# Patient Record
Sex: Male | Born: 1960 | Race: Black or African American | Hispanic: No | Smoking: Former smoker
Health system: Southern US, Community
[De-identification: ages and names within clinical notes are randomized; demographics above are authoritative.]

## PROBLEM LIST (undated history)

## (undated) DIAGNOSIS — M5430 Sciatica, unspecified side: Secondary | ICD-10-CM

## (undated) DIAGNOSIS — R319 Hematuria, unspecified: Secondary | ICD-10-CM

## (undated) DIAGNOSIS — G8929 Other chronic pain: Secondary | ICD-10-CM

## (undated) DIAGNOSIS — M549 Dorsalgia, unspecified: Secondary | ICD-10-CM

---

## 2004-02-20 ENCOUNTER — Emergency Department (HOSPITAL_COMMUNITY): Admission: EM | Admit: 2004-02-20 | Discharge: 2004-02-20 | Payer: Self-pay | Admitting: Emergency Medicine

## 2004-02-25 ENCOUNTER — Ambulatory Visit (HOSPITAL_COMMUNITY): Admission: RE | Admit: 2004-02-25 | Discharge: 2004-02-25 | Payer: Self-pay | Admitting: Emergency Medicine

## 2004-07-17 ENCOUNTER — Ambulatory Visit: Payer: Self-pay | Admitting: Sports Medicine

## 2004-08-15 ENCOUNTER — Ambulatory Visit: Payer: Self-pay | Admitting: Family Medicine

## 2004-09-10 ENCOUNTER — Ambulatory Visit: Payer: Self-pay | Admitting: Family Medicine

## 2004-10-15 ENCOUNTER — Ambulatory Visit: Payer: Self-pay | Admitting: Family Medicine

## 2004-11-18 ENCOUNTER — Ambulatory Visit: Payer: Self-pay | Admitting: Sports Medicine

## 2004-12-18 ENCOUNTER — Ambulatory Visit: Payer: Self-pay | Admitting: Family Medicine

## 2005-01-17 ENCOUNTER — Ambulatory Visit: Payer: Self-pay | Admitting: Family Medicine

## 2006-01-10 ENCOUNTER — Emergency Department (HOSPITAL_COMMUNITY): Admission: EM | Admit: 2006-01-10 | Discharge: 2006-01-10 | Payer: Self-pay | Admitting: Emergency Medicine

## 2006-06-27 ENCOUNTER — Emergency Department (HOSPITAL_COMMUNITY): Admission: EM | Admit: 2006-06-27 | Discharge: 2006-06-27 | Payer: Self-pay | Admitting: Emergency Medicine

## 2006-06-29 ENCOUNTER — Ambulatory Visit: Payer: Self-pay | Admitting: Sports Medicine

## 2006-06-30 ENCOUNTER — Ambulatory Visit (HOSPITAL_COMMUNITY): Admission: RE | Admit: 2006-06-30 | Discharge: 2006-06-30 | Payer: Self-pay | Admitting: Sports Medicine

## 2006-07-13 ENCOUNTER — Ambulatory Visit: Payer: Self-pay | Admitting: Family Medicine

## 2006-07-20 ENCOUNTER — Ambulatory Visit (HOSPITAL_COMMUNITY): Admission: RE | Admit: 2006-07-20 | Discharge: 2006-07-20 | Payer: Self-pay | Admitting: Neurosurgery

## 2006-07-23 ENCOUNTER — Encounter: Payer: Self-pay | Admitting: Family Medicine

## 2006-07-23 ENCOUNTER — Ambulatory Visit: Payer: Self-pay | Admitting: Family Medicine

## 2009-10-22 ENCOUNTER — Emergency Department (HOSPITAL_COMMUNITY): Admission: EM | Admit: 2009-10-22 | Discharge: 2009-10-22 | Payer: Self-pay | Admitting: Emergency Medicine

## 2009-11-07 ENCOUNTER — Ambulatory Visit: Payer: Self-pay | Admitting: Family Medicine

## 2009-11-07 DIAGNOSIS — R300 Dysuria: Secondary | ICD-10-CM

## 2009-11-07 DIAGNOSIS — F17200 Nicotine dependence, unspecified, uncomplicated: Secondary | ICD-10-CM | POA: Insufficient documentation

## 2009-11-07 DIAGNOSIS — Q762 Congenital spondylolisthesis: Secondary | ICD-10-CM

## 2009-11-07 DIAGNOSIS — M25559 Pain in unspecified hip: Secondary | ICD-10-CM

## 2009-11-08 ENCOUNTER — Ambulatory Visit: Payer: Self-pay | Admitting: Family Medicine

## 2009-11-08 ENCOUNTER — Encounter: Payer: Self-pay | Admitting: Family Medicine

## 2009-11-09 LAB — CONVERTED CEMR LAB
CO2: 21 meq/L (ref 19–32)
Calcium: 9 mg/dL (ref 8.4–10.5)
Chloride: 104 meq/L (ref 96–112)
Cholesterol: 214 mg/dL — ABNORMAL HIGH (ref 0–200)
Creatinine, Ser: 0.74 mg/dL (ref 0.40–1.50)
Glucose, Bld: 81 mg/dL (ref 70–99)
Sodium: 139 meq/L (ref 135–145)
Total CHOL/HDL Ratio: 3.1
Triglycerides: 71 mg/dL (ref <150)

## 2009-11-13 LAB — CONVERTED CEMR LAB
Bilirubin Urine: NEGATIVE
Glucose, Urine, Semiquant: NEGATIVE
Protein, U semiquant: 30
Specific Gravity, Urine: >=1.03
Urobilinogen, UA: 0.2
WBC Urine, dipstick: NEGATIVE

## 2009-11-14 ENCOUNTER — Ambulatory Visit: Payer: Self-pay | Admitting: Family Medicine

## 2009-11-15 ENCOUNTER — Encounter: Payer: Self-pay | Admitting: Family Medicine

## 2009-11-15 LAB — CONVERTED CEMR LAB
Ketones, urine, test strip: NEGATIVE
Nitrite: NEGATIVE
Protein, U semiquant: NEGATIVE
Specific Gravity, Urine: >=1.03
WBC Urine, dipstick: NEGATIVE

## 2009-11-20 ENCOUNTER — Ambulatory Visit: Payer: Self-pay | Admitting: Family Medicine

## 2009-11-20 DIAGNOSIS — R319 Hematuria, unspecified: Secondary | ICD-10-CM

## 2009-12-07 ENCOUNTER — Encounter: Payer: Self-pay | Admitting: Family Medicine

## 2010-01-16 ENCOUNTER — Encounter: Payer: Self-pay | Admitting: Family Medicine

## 2010-07-03 NOTE — Consult Note (Signed)
[facility]   Imported By: Clydell Hakim 01/22/2010 10:32:00  _____________________________________________________________________  External Attachment:    Type:   Image     Comment:   External Document

## 2010-07-03 NOTE — Assessment & Plan Note (Signed)
Summary: 50yo M new pt   Vital Signs:  Patient profile:   50 year old male Height:      65.5 inches Weight:      168.2 pounds BMI:     27.66 Temp:     98.6 degrees F oral Pulse rate:   85 / minute BP sitting:   136 / 84  (left arm) Cuff size:   regular  Vitals Entered By: Gladstone Pih (November 07, 2009 4:38 PM) CC: L hip pain Pain Assessment Patient in pain? yes     Location: back Intensity: 7 Type: aching Onset of pain  Chronic Comments last seen approx 2008   Primary Care Provider:  Marisue Ivan, MD  CC:  L hip pain.  History of Present Illness: 50yo M new patient to me  Health Problems:   1. L hip pain: Chronic issue.  He was told that it was bursitis and was given meloxicam which has mildly improve his symptoms.  Denies any trauma or fall.  No pain with movements.  No radiating pain into the groin area.  Pain with palpation.  No erythema, edema, or ecchymosis.  2. Tobacco user: 1/4 ppd.    3.  Acute dysuria and hematuria: x several days.  No penile discharge.  No scrotal pain.  No fevers, abd/pelvic pain.  Unable to provide urine today.  4. Hx of spondylolisthesis of L4/L5    Habits & Providers  Alcohol-Tobacco-Diet     Tobacco Status: current     Tobacco Counseling: to quit use of tobacco products     Cigarette Packs/Day: <0.25  Current Medications (verified): 1)  Meloxicam 7.5 Mg Tabs (Meloxicam) .Marland Kitchen.. 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 Well appearing, NAD.  Neck:  supple, full ROM, no goiter or mass  Lungs:  Normal respiratory effort, chest expands symmetrically. Lungs are clear to auscultation, no crackles or wheezes. Heart:  Normal rate and regular rhythm. S1 and S2 normal without gallop, murmur, click, rub or other extra sounds. Abdomen:  Soft, NT, ND, no HSM, active BS  Prostate:  declined Msk:  no joint effusion or erythema FROM all joints L hip- nl internal/external rotation; nl flexion; neg log roll; ttp of greater trochanter Extremities:  no peripheral edema Neurologic:  no focal deficits 5/5 strength throughout Skin:  no lesions   Impression & Recommendations:  Problem # 1:  DYSURIA (ICD-788.1) Assessment New Concern for UTI vs. Prostatitis vs. other infection. Pt unable to provide urine.  Afebrile and no scrotal pain or discharge.  Pt declined prostate exam. Plan to have him come in tomorrow to provide UA sample.    Future Orders: Urinalysis-[facility] (00000) ... 11/08/2009  Problem # 2:  HIP PAIN, LEFT (ICD-719.45) Assessment: Unchanged Likely L greater trochanteric bursitis. Plan to complete the course of Meloxicam and if he is still  having discomfort, will discuss possible steroid injection.  His updated medication list for this problem includes:    Meloxicam 7.5 Mg Tabs (Meloxicam) .Marland Kitchen... 1 tab by mouth two times a day  Problem # 3:  SPONDYLOLISTHESIS (WGN-562.13) Assessment: Comment Only No neurological deficits. Will f/u accordingly.  Problem # 4:  TOBACCO USER (ICD-305.1) Assessment: Unchanged 1/4 ppd.  Smoking cessation counseling provided. Contemplative stage.  Problem # 5:  HEALTH MAINTENANCE EXAM (ICD-V70.0) Assessment: Comment Only Will check baseline BMET and screen for HLD. Will discuss further early PSA  screening b/c of ethnicity and higher risks.  Orders: [facility] - Est  40-64 yrs (08657)  Complete Medication List: 1)  Meloxicam 7.5 Mg Tabs (Meloxicam) .Marland Kitchen.. 1 tab by mouth two times a day  Other Orders: Future Orders: Basic Met-[facility] ([id number]) ... 11/08/2009 Lipid-[facility] ([id number]) ... 11/08/2009  Patient Instructions: 1)  Set up an appt after you finish the meloxicam if your pain is not improved.   I agree with you that it is a bursitis and likely needs an injection. 2)  Come back to the lab in the morning without breakfast for bloodwork.  Call the clinic 24 hours ahead of time to let them know.

## 2010-07-03 NOTE — Assessment & Plan Note (Signed)
Summary: hematuria    Vital Signs:  Patient profile:   50 year old male Height:      65.5 inches Weight:      171.9 pounds BMI:     28.27 Temp:     98.3 degrees F oral Pulse rate:   76 / minute BP sitting:   124 / 75  (left arm) Cuff size:   regular  Vitals Entered By: Jone Baseman CMA (November 20, 2009 1:31 PM) CC: F/U labs Is Patient Diabetic? No Pain Assessment Patient in pain? no        Primary Care Provider:  Marisue Ivan, MD  CC:  F/U labs.  History of Present Illness: 50yo M here to f/u on hematuria  Hematuria: >2 weeks.  UA positive for blood x 2.  Ur cx neg.  Pt reports intermittent hematuria.  No clots.  Occasional dysuria.  No fevers, chills, flank pain, penile discharge, abd/pelvic pain, or family hx of kidney disease.  No trouble starting/stopping or dribbling.  Habits & Providers  Alcohol-Tobacco-Diet     Tobacco Status: current     Tobacco Counseling: to quit use of tobacco products     Cigarette Packs/Day: <0.25  Current Medications (verified): 1)  None  Allergies (verified): No Known Drug Allergies  Review of Systems      See HPI  Physical Exam  General:  VS Reviewed. Well appearing, NAD.  Abdomen:  Soft, NT, ND, no HSM, active BS  Genitalia:  No penile discharge; no lesions or abrasions of the meatus; no scrotal tenderness Prostate:  Prostate gland firm and smooth, no enlargement, nodularity, tenderness, mass, asymmetry or induration.   Impression & Recommendations:  Problem # 1:  HEMATURIA UNSPECIFIED (ICD-599.70) Assessment Unchanged Persistent hematuria >2 weeks without clear etiology. UA positive for blood x 2.  Ur cx neg.   Prostate exam nl.  Did not check PSA b/c he going to be referred to urology and will defer further labs. He will likely need further imaging to r/o malignancy.  Orders: Urology Referral (Urology) [facility]- Est Level  3 (16109)  Patient Instructions: 1)  Please follow up with urology for further  evaluation.

## 2010-07-03 NOTE — Consult Note (Signed)
[facility]: [facility]   Imported By: Knox Royalty 12/22/2009 13:24:18  _____________________________________________________________________  External Attachment:    Type:   Image     Comment:   External Document

## 2011-09-05 ENCOUNTER — Ambulatory Visit: Payer: Self-pay

## 2011-09-08 ENCOUNTER — Encounter: Payer: Self-pay | Admitting: Family Medicine

## 2011-09-08 ENCOUNTER — Ambulatory Visit (INDEPENDENT_AMBULATORY_CARE_PROVIDER_SITE_OTHER): Payer: PRIVATE HEALTH INSURANCE | Admitting: Family Medicine

## 2011-09-08 VITALS — BP 131/88 | HR 96 | Temp 98.6°F | Ht 68.0 in | Wt 166.2 lb

## 2011-09-08 DIAGNOSIS — A599 Trichomoniasis, unspecified: Secondary | ICD-10-CM

## 2011-09-08 DIAGNOSIS — Z87448 Personal history of other diseases of urinary system: Secondary | ICD-10-CM | POA: Insufficient documentation

## 2011-09-08 DIAGNOSIS — Q762 Congenital spondylolisthesis: Secondary | ICD-10-CM

## 2011-09-08 LAB — POCT URINALYSIS DIPSTICK
Ketones, UA: NEGATIVE
Spec Grav, UA: 1.02
pH, UA: 6.5

## 2011-09-08 LAB — POCT UA - MICROSCOPIC ONLY

## 2011-09-08 MED ORDER — NAPROXEN 500 MG PO TABS: 500.0000 mg | ORAL_TABLET | Freq: Two times a day (BID) | ORAL | Status: DC | PRN

## 2011-09-08 NOTE — Progress Notes (Signed)
Subjective:     Patient ID: Jonathon Cunningham, male   DOB: 07-Mar-1961, 51 y.o.   MRN: [id number]  HPI  Jonathon Cunningham is a 51 yo w hx of spondylolisthesis c/o of worsening mid-back pain for past yr. Pain first appeared at 51yo following trauma to back. Now, his symptoms are accompanied by R>L LE numbness and pain. Pain is worse in the morning, often wakening him from sleep at night, and with inactivity/at rest. He works with heavy lifting; staying activity and hot morning showers significantly improves symptoms. He has tried Motrin for relief, often taking 1-3/day. He hasn't taken them in a week.  Jonathon Cunningham was evaluated in 2000 at [facility] by Dr. Anda Kraft and at [facility] in 2006 for his condition with MRIs. [facility], who [facility] - his insurance ran out at this time. He has never tried PT.   At last clinic visit 51/2011, he was evaluated for gross hematuria. Jonathon. Cunningham reports that after initial consult with urology, his insurance ran out, and he was unable to pursue the cystoscopy.  Review of Systems     Objective:   Physical Exam Gen: Well appearing, NAD Back: symmetric appearing, negative for scoliosis on gross exam. No tenderness to palpation of paraspinous muscles. Slight pinpoint tenderness at L4 (around umbilicus). Negative straight leg raise bilaterally. Neuro: 2+ patellar reflexes, BL LE sensation grossly intact    Assessment:         Plan:   1. Jonathon. Cunningham has significant back pain associated with his confirmed diagnosis of spondylolisthesis. [facility], who recommended surgery - he does not want to pursue this route. He was informed of PT, who can recommend safe lifting recommendations and back strengthening - he denies this. Jonathon. Cunningham will be given Naproxen 500mg  BID for pain relief. He is also encouraged to continue physical activity. If pain worsens, will refer to neurosurgery. 2. Will do UA today for microscopic hematuria. If  positive, will refer to urologist. If hematuria is now resolved, will monitor closely at subsequent visits. 3. Care continuity: Jonathon. Cunningham encouraged to f/u in clinic for an annual wellness exam

## 2011-09-08 NOTE — Assessment & Plan Note (Signed)
Seen on urinalysis.  Unable to reach patient- will send letter and attempt to call back again.

## 2011-09-08 NOTE — Patient Instructions (Signed)
Will prescribe naprosyn for back pain.  Take with food.  Caution- can upset stomach or cause bleeding  If back pain worsens, will refer you back to see your neurosurgeon  Make follow-up for physical

## 2011-09-08 NOTE — Assessment & Plan Note (Signed)
He declines physical therapy.  Will rx naprosyn.  Advised is worsens, would refer back to his neurosurgeon.  Given red flags for return.

## 2011-09-08 NOTE — Progress Notes (Signed)
  Subjective:    Patient ID: Jonathon Cunningham, male    DOB: [birth year], 51 y.o.   MRN: [id number]  HPIDiscussed and examined patient with MS3- agree with note as documented.  51 yo here for evaluation of chronic back pain  PMH sig for spondylolisthesis.  Notes chronic back pain for decades.  Was evaluated by Dr. Jule Ser several years ago.  Patient states he rec surgery.   Continued low back pain worsening in past year.  Taking 1-2 ibuprofen per day.  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  Pain sometimes radiated down back of right upper leg.    No weakness, numbness.  Sometimes tingling.  I have reviewed patient's  PMH, FH, and Social history and Medications as related to this visit.  Review of Systems No fever, chills    Objective:   Physical Exam GEN: Alert & Oriented, No acute distress Back: nontender to palpation.  Neg straight leg raise Neuro:  2+ patella reflexes bilaterally.  Normal strength 5/5/ bilaterally.        Assessment & Plan:

## 2011-09-08 NOTE — Assessment & Plan Note (Signed)
Trace blood seen today-in the setting of trichomonas.  Will ask patient to repeat test after infection cleared.

## 2011-09-09 ENCOUNTER — Emergency Department (HOSPITAL_COMMUNITY)
Admission: EM | Admit: 2011-09-09 | Discharge: 2011-09-10 | Disposition: A | Discharge status: Home / Self Care | Payer: PRIVATE HEALTH INSURANCE | Attending: Emergency Medicine | Admitting: Emergency Medicine

## 2011-09-09 ENCOUNTER — Telehealth: Payer: Self-pay | Admitting: *Deleted

## 2011-09-09 ENCOUNTER — Emergency Department (HOSPITAL_COMMUNITY): Payer: PRIVATE HEALTH INSURANCE

## 2011-09-09 ENCOUNTER — Encounter (HOSPITAL_COMMUNITY): Payer: Self-pay | Admitting: Emergency Medicine

## 2011-09-09 DIAGNOSIS — X58XXXA Exposure to other specified factors, initial encounter: Secondary | ICD-10-CM | POA: Insufficient documentation

## 2011-09-09 DIAGNOSIS — Y9367 Activity, basketball: Secondary | ICD-10-CM | POA: Insufficient documentation

## 2011-09-09 DIAGNOSIS — M25569 Pain in unspecified knee: Secondary | ICD-10-CM | POA: Insufficient documentation

## 2011-09-09 DIAGNOSIS — S86819A Strain of other muscle(s) and tendon(s) at lower leg level, unspecified leg, initial encounter: Secondary | ICD-10-CM

## 2011-09-09 DIAGNOSIS — F17200 Nicotine dependence, unspecified, uncomplicated: Secondary | ICD-10-CM | POA: Insufficient documentation

## 2011-09-09 DIAGNOSIS — S838X9A Sprain of other specified parts of unspecified knee, initial encounter: Secondary | ICD-10-CM | POA: Insufficient documentation

## 2011-09-09 MED ORDER — METRONIDAZOLE 500 MG PO TABS: ORAL_TABLET | ORAL | Status: DC

## 2011-09-09 NOTE — Progress Notes (Signed)
Addended by: Macy Mis on: 09/09/2011 12:30 PM   Modules accepted: Orders

## 2011-09-09 NOTE — ED Notes (Signed)
PT. REPORTS RIGHT KNEE INJURY WHILE PLAYING BASKETBALL THIS EVENING , PAIN WITH MOVEMENT / CERTAIN POSITIONS.

## 2011-09-09 NOTE — Telephone Encounter (Signed)
Called pt and told him that his results came back positive for trich. And that she will send the meds into [facility] on [address]. I told him that he will need to inform his partner that they will need to be treated also and to be sure to pick up his meds and take them all and not to have sex. And once he has finished the treatment to wait one week and when he did resume sexual activities to ALWAYS use CONDOMS!! Pt voiced understanding.Laureen Ochs, Viann Shove

## 2011-09-10 MED ORDER — OXYCODONE-ACETAMINOPHEN 5-325 MG PO TABS
2.0000 | ORAL_TABLET | Freq: Once | ORAL | Status: AC
  Administered 2011-09-10: 2 via ORAL
  Filled 2011-09-10: qty 2

## 2011-09-10 MED ORDER — OXYCODONE-ACETAMINOPHEN 5-325 MG PO TABS: ORAL_TABLET | ORAL | Status: AC

## 2011-09-10 NOTE — ED Provider Notes (Signed)
Medical screening examination/treatment/procedure(s) were performed by non-physician practitioner and as supervising physician I was immediately available for consultation/collaboration.  Harley Mccartney R. Leann Mayweather, MD 09/10/11 0711 

## 2011-09-10 NOTE — ED Notes (Signed)
Ortho tech on the way.  

## 2011-09-10 NOTE — Discharge Instructions (Signed)
Ice knee and wear immobilizer. Use crutches for comfort. Take Percocet as needed for pain but do not drive or operate machinery with use. May need over-the-counter stool softener with use. Call Dr. Veda Canning office in the morning to establish close followup tomorrow. Explained that you were seen in ER and Dr. Ave Filter wants to see in his office tomorrow for followup.  Patellar Tendon Tear / Disruption with Rehab A patellar tendon tear or disruption is a complete tear of the tendon below the kneecap. The patellar tendon attaches the thigh muscle (quadriceps) to the shinbone (tibia). These muscles are responsible for bending the knee and flexing the hip. A tear in the patellar tendon results in a disability to perform these actions. SYMPTOMS   A "pop" or tear felt in the knee or under the kneecap, at the time of injury.   Pain, tenderness, swelling, warmth, or redness over and around the patellar tendon.   Pain that gets worse when trying to forcefully straighten the knee or bend the knee.   Inability to straighten the knee when seated.   Crackling sound (crepitation) when the tendon is moved or touched.   Bruising (contusion) around the knee within 48 hours of injury.   Loss of firm fullness when pushing on the area where the tendon ruptured (a defect between the ends of the tendon where they separated from each other).  CAUSES  The patellar tendon tears when a force is placed on it that is greater than it can handle. Common causes of injury include:  A stressful incident, such as with jumping, hurdling, or starting a sprint.   Direct hit (trauma) to the knee.  RISK INCREASES WITH:  Sports that require sudden, explosive muscle contraction, such as those involving jumping or quick starts.   Running or contact sports.   Poor strength and flexibility.   Previous patellar tendon injury.   Untreated patellar tendinitis.   Corticosteroid injection into the patellar tendon.  (Corticosteroid injections weaken tendons.)  PREVENTION  Warm up and stretch properly before activity.   Allow for adequate recovery between workouts.   Maintain physical fitness:   Strength, flexibility, and endurance.   Cardiovascular fitness.   Protect the knee with taping, protective strapping, or elastic compression bandage during activity.  PROGNOSIS  If treated properly, patellar tendon tears usually heal, with a return to sports within 6 to 9 months after injury.  RELATED COMPLICATIONS   Weakness of the thigh (quadriceps) muscles, especially if the tear is left untreated.   Re-rupture of the tendon after treatment.   Prolonged disability.   Risks of surgery: infection, injury to nerves (numbness, weakness, or paralysis), bleeding, knee stiffness, knee weakness, pain when sitting for long periods, pain when getting up from a seated position and when kneeling or squatting, pain going up or down stairs or hills, and knee giving way or buckling.  TREATMENT  Treatment first involves resting from any activities that aggravate the symptoms. The use of ice and medicine will help reduce pain and inflammation. Applying a compression bandage and elevating the knee above the level of the heart will also help reduce inflammation. Definitive treatment for patellar tendon tears is surgery, because contraction of the quadriceps tendon prevents healing of the tendon. Surgery often involves using stitches (sutures) to sew the ends of the tendon back together. Surgery is followed by restraint of the knee, to allow for healing. After restraint, it is important to perform strengthening and stretching exercises to help regain strength and a  full range of motion. These exercises may be completed at home or with a therapist.  MEDICATION   If pain medicine is needed, nonsteroidal anti-inflammatory medicines (aspirin and ibuprofen), or other minor pain relievers (acetaminophen), are often advised.   Do  not take pain medicine for 7 days before surgery.   Prescription pain relievers may be given, if your caregiver thinks they are needed. Use only as directed and only as much as you need.  COLD THERAPY  Cold treatment (icing) should be applied for 10 to 15 minutes every 2 to 3 hours for inflammation and pain, and immediately after activity that aggravates your symptoms. Use ice packs or an ice massage. SEEK MEDICAL CARE IF:  Pain increases, despite treatment.   Cast discomfort develops.   Any of the following occur after surgery: signs of infection, including fever, increased pain, swelling, redness, drainage of fluids, or bleeding in the affected area.   New, unexplained symptoms develop. (Drugs used in treatment may produce side effects.)  EXERCISES RANGE OF MOTION (ROM) AND STRETCHING EXERCISES - Patellar Tendon Tear/Disruption These exercises may help you when beginning to rehabilitate your injury. Your symptoms may resolve with or without further involvement from your physician, physical therapist or athletic trainer. While completing these exercises, remember:   Restoring tissue flexibility helps normal motion to return to the joints. This allows healthier, less painful movement and activity.   An effective stretch should be held for at least 30 seconds.   A stretch should never be painful. You should only feel a gentle lengthening or release in the stretched tissue.  RANGE OF MOTION - Knee Flexion and Extension, Active-Assisted  Sit on the edge of a table or chair with your thighs firmly supported. It may be helpful to place a folded towel under the end of your right / left thigh.   Flexion (bending): Place the ankle of your healthy leg on top of the other ankle. Use your healthy leg to gently bend your right / left knee until you feel a mild tension across the top of your knee.   Hold for __________ seconds.   Extension (straightening): Switch your ankles so your right / left  leg is on top. Use your healthy leg to straighten your right / left knee until you feel a mild tension on the backside of your knee.   Hold for __________ seconds.  Repeat __________ times. Complete this exercise __________ times per day. RANGE OF MOTION - Knee Flexion, Active  Lie on your back with both knees straight. (If this causes back discomfort, bend your healthy knee, placing your foot flat on the floor.)   Slowly slide your heel back toward your buttocks until you feel a gentle stretch in the front of your knee or thigh.   Hold for __________ seconds. Slowly slide your heel back to the starting position.  Repeat __________ times. Complete this exercise __________ times per day.  STRENGTHENING EXERCISES - Patellar Tendon Tear/Disruption  These exercises may help you when beginning to rehabilitate your injury. They may resolve your symptoms with or without further involvement from your physician, physical therapist or athletic trainer. While completing these exercises, remember:   Muscles can gain both the endurance and the strength needed for everyday activities through controlled exercises.   Complete these exercises as instructed by your physician, physical therapist or athletic trainer. Increase the resistance and repetitions only as guided by your caregiver.  STRENGTH - Quadriceps, Isometrics  Lie on your back with your  right / left leg extended and your opposite knee bent.   Gradually tense the muscles in the front of your right / left thigh. You should see either your kneecap slide up toward your hip or increased dimpling just above the knee. This motion will push the back of the knee down toward the floor, mat, or bed on which you are lying.   Hold the muscle as tight as you can without increasing your pain for __________ seconds.   Relax the muscles slowly and completely between each repetition.  Repeat __________ times. Complete this exercise __________ times per day.    STRENGTH - Quadriceps, Straight Leg Raises Quality counts! Watch for signs that the quadriceps muscle is working, to be sure you are strengthening the correct muscles and not "cheating" by substituting with healthier muscles.  Lay on your back with your right / left leg extended and your opposite knee bent.   Tense the muscles in the front of your right / left thigh. You should see either your kneecap slide up or increased dimpling just above the knee. Your thigh may even shake a bit.   Tighten these muscles even more and raise your leg 4 to 6 inches off the floor. Hold for __________ seconds.   Keeping these muscles tense, lower your leg.   Relax the muscles slowly and completely between each repetition.  Repeat __________ times. Complete this exercise __________ times per day.  STRENGTH - Hip Abductors, Straight Leg Raises  Be aware of your form throughout the entire exercise, so that you exercise the correct muscles Poor form means that you are not strengthening the correct muscles.  Lie on your side so that your head, shoulders, knee and hip line up. You may bend your lower knee to help maintain your balance. Your right / left leg should be on top.   Roll your hips slightly forward, so that your hips are stacked directly over each other and your right / left knee is facing forward.   Lift your top leg up 4-6 inches, leading with your heel. Be sure that your foot does not drift forward or that your knee does not roll toward the ceiling.   Hold this position for __________ seconds. You should feel the muscles in your outer hip lifting. (You may not notice this until your leg begins to tire.)   Slowly lower your leg to the starting position. Allow the muscles to fully relax before beginning the next repetition.  Repeat __________ times. Complete this exercise __________ times per day.  STRENGTH - Hip Extensors, Straight Leg Raises  Lie on your stomach on a firm surface.   Tense the  muscles in your buttocks to lift your right / left leg about 4 inches. If you cannot lift your leg this high without arching your back, place a pillow under your hips.   Keep your knee straight. Hold __________ seconds.   Slowly lower your leg to the starting position and allow it to relax completely before starting the next repetition.  Repeat __________ times. Complete this exercise __________ times per day.  STRENGTH - Hip Adductors, Straight Leg Raises  Lie on your side so that your head, shoulders, knee and hip line up. You may place your upper foot in front, to help maintain your balance. Your right / left leg should be on the bottom.   Roll your hips slightly forward, so that your hips are stacked directly over each other and your right / left knee  is facing forward.   Tense the muscles in your inner thigh and lift your bottom leg 4-6 inches. Hold this position for __________ seconds.   Slowly lower your leg to the starting position. Allow the muscles to fully relax before beginning the next repetition.  Repeat __________ times. Complete this exercise __________ times per day. Document Released: 05/19/2005 Document Revised: 05/08/2011 Document Reviewed: 08/31/2008 [facility] Patient Information 2012 [place], [place].

## 2011-09-10 NOTE — ED Provider Notes (Signed)
History     CSN: [id number]  Arrival date & time 09/09/11  1945   First MD Initiated Contact with Patient 09/10/11 0101      Chief Complaint  Patient presents with  . Knee Injury    (Consider location/radiation/quality/duration/timing/severity/associated sxs/prior treatment) HPI  Patient presents to emergency department complaining of right knee injury stating that he was shooting basketball jumped, came down, and felt a pop in his right knee. Patient had acute onset pain was gradual onset swelling. Patient states decreased range of motion due to pain and swelling of knee. Patient states he has history of some back pain but no other known medical problems and takes no medicines on regular basis. Patient denies additional injury. Symptoms are acute onset, persistent, and unchanging. Patient for pain prior to arrival.  History reviewed. No pertinent past medical history.  History reviewed. No pertinent past surgical history.  No family history on file.  History  Substance Use Topics  . Smoking status: Current Everyday Smoker  . Smokeless tobacco: Not on file  . Alcohol Use: Yes      Review of Systems  All other systems reviewed and are negative.    Allergies  Review of patient's allergies indicates no known allergies.  Home Medications   Current Outpatient Rx  Name Route Sig Dispense Refill  . METRONIDAZOLE 500 MG PO TABS Oral Take 2,000 mg by mouth 3 (three) times daily. 4 tablets all at once.  No sex until partners are informed and treated    . NAPROXEN 500 MG PO TABS Oral Take 500 mg by mouth 2 (two) times daily as needed. For pain      BP 154/87  Pulse 81  Temp(Src) 99.4 F (37.4 C) (Oral)  Resp 16  SpO2 98%  Physical Exam  Nursing note and vitals reviewed. Constitutional: He is oriented to person, place, and time. He appears well-developed.  HENT:  Head: Normocephalic and atraumatic.  Eyes: Conjunctivae are normal.  Neck: Normal range of motion. Neck  supple.  Cardiovascular: Normal rate.   Pulmonary/Chest: Effort normal.  Musculoskeletal: Normal range of motion. He exhibits edema.       TTP of entire knee without laxity of ant/post/med/lateral stress. Laxity and high riding patella with decreased range of motion due to pain Negative bollotment.  No heat or erythema of knee. FROM of bilateral hips without pain. No TTP or remainder of lower extremities.     Neurological: He is alert and oriented to person, place, and time.       Normal sensation of entire left and right lower extremities  Skin: Skin is warm and dry. No rash noted. No erythema. No pallor.    ED Course  Procedures (including critical care time)  PO percocet  Ice pack,   Knee immobilizer and crutches.   1:20 AM Case discussed with both Dr. Rubin Payor and Dr. Ave Filter. Xray and PE discussed with Dr. Ave Filter with concern for patellar tendon injury. Dr. Ave Filter requests knee immobilizer, crutches, ice, and pain medication with patient call his office in the morning to establish followup visit for tomorrow. Patient is agreeable plan. Dr. Ave Filter took patient's name.  Labs Reviewed - No data to display Dg Knee Complete 4 Views Right  09/09/2011  *RADIOLOGY REPORT*  Clinical Data: Injury.  Pain.  RIGHT KNEE - COMPLETE 4+ VIEW  Comparison: None.  Findings: Lateral view is rotated limiting evaluation.  No fracture.  Prominent prepatellar soft tissue.  This may be related to rotation although prepatellar  bursitis or soft tissue injury not excluded.  The patella is high riding.  Injury to the patellar tendon not excluded.  IMPRESSION: Lateral view is rotated limiting evaluation.  No fracture.  Prominent prepatellar soft tissue.  This may be related to rotation although prepatellar bursitis or soft tissue injury not excluded.  The patella is high riding.  Injury to the patellar tendon not excluded.  Original Report Authenticated By: Fuller Canada, M.D.     1. Patellar tendon  rupture       MDM  Bilateral lower extremities are neurovascularly intact with clinical concern for ruptured patellar tendon. Spoke with Dr. Ave Filter will see patient in office next day for followup.        Jenness Corner, [place] 09/10/11 [phone]

## 2011-09-10 NOTE — ED Notes (Signed)
Painful  Rt knee still

## 2011-09-11 ENCOUNTER — Telehealth: Payer: Self-pay | Admitting: Family Medicine

## 2011-09-11 NOTE — Telephone Encounter (Signed)
Patient is calling with some questions about the medication he was prescribed and what it is to treat for.

## 2011-09-11 NOTE — Telephone Encounter (Signed)
1.) called home number. Unable to leave message. (lab number?) 2.) called cell number. Disconnected. Lorenda Hatchet, Renato Battles

## 2011-09-12 ENCOUNTER — Telehealth: Payer: Self-pay | Admitting: *Deleted

## 2011-09-12 NOTE — Telephone Encounter (Signed)
Noted and agree. 

## 2011-09-12 NOTE — Telephone Encounter (Signed)
Pt called back. Explained to pt that his last UA showed trichomonas. Advised to take the flagyl for it, needs treatment. Pt states: 'I don't have any thing. I only have one sexual partner and she does not have any thing either.' I advised the pt to take the medicine and explained what trichomonas are. Pt said he has the medicine in his hand, but he will also talk to his partner to get tested. Fwd. To Dr.Briscoe for info. Lorenda Hatchet, Renato Battles

## 2012-10-15 ENCOUNTER — Encounter: Payer: Self-pay | Admitting: Family Medicine

## 2012-10-15 ENCOUNTER — Ambulatory Visit (INDEPENDENT_AMBULATORY_CARE_PROVIDER_SITE_OTHER): Payer: PRIVATE HEALTH INSURANCE | Admitting: Family Medicine

## 2012-10-15 VITALS — BP 151/99 | HR 57 | Ht 68.0 in | Wt 156.0 lb

## 2012-10-15 DIAGNOSIS — M7712 Lateral epicondylitis, left elbow: Secondary | ICD-10-CM

## 2012-10-15 DIAGNOSIS — R319 Hematuria, unspecified: Secondary | ICD-10-CM

## 2012-10-15 DIAGNOSIS — Z87448 Personal history of other diseases of urinary system: Secondary | ICD-10-CM

## 2012-10-15 DIAGNOSIS — Q762 Congenital spondylolisthesis: Secondary | ICD-10-CM

## 2012-10-15 DIAGNOSIS — R030 Elevated blood-pressure reading, without diagnosis of hypertension: Secondary | ICD-10-CM

## 2012-10-15 DIAGNOSIS — M25529 Pain in unspecified elbow: Secondary | ICD-10-CM

## 2012-10-15 DIAGNOSIS — M7710 Lateral epicondylitis, unspecified elbow: Secondary | ICD-10-CM

## 2012-10-15 LAB — POCT URINALYSIS DIPSTICK
Bilirubin, UA: NEGATIVE
Glucose, UA: NEGATIVE
Leukocytes, UA: NEGATIVE
Nitrite, UA: NEGATIVE

## 2012-10-15 LAB — POCT UA - MICROSCOPIC ONLY

## 2012-10-15 MED ORDER — MELOXICAM 15 MG PO TABS: 15.0000 mg | ORAL_TABLET | Freq: Every day | ORAL | Status: DC

## 2012-10-15 NOTE — Assessment & Plan Note (Signed)
No red flags for neurologic compromise.  Patient would like second opinion from a different neurosurgeon on need for surgery.  i printed out two MRI reports of his back for him and counseled he may contact neurosurgeon of his choice for further evaluation

## 2012-10-15 NOTE — Patient Instructions (Addendum)
See instructions and brace for elbow pain  Make appointment for physical- come fasting and your doctor will get bloodwork  You may contact a neurosurgeon of your choice for a second opinion on your back  If there is blood in your urine, we will call to set you up with a urologist

## 2012-10-15 NOTE — Assessment & Plan Note (Signed)
First time noted today's visit.  Patient will return for physical and fasting labs, will recheck at that time

## 2012-10-15 NOTE — Addendum Note (Signed)
Addended by: [place], Joseluis Alessio on: 10/15/2012 12:45 PM   Modules accepted: Orders

## 2012-10-15 NOTE — Assessment & Plan Note (Signed)
Counseled on expected time course of improvement icing, rx meloxicam, discussed home exercise program- handout given.

## 2012-10-15 NOTE — Progress Notes (Signed)
  Subjective:    Patient ID: Jonathon Cunningham, male    DOB: [birth year], 52 y.o.   MRN: [id number]  HPI Here to evaluate low back pain and acute left elbow pain  Chronic back pain:  Chronic for years with intermittent spasms.- has history of spondylolisthesis evaluated by specialists, most recently several years ago by Dr. Annett Fabian who patietn recommended surgery. Feels in the past few months increase in back spasms. Last seen about 1 year ago- declined physical therapy or re-referral to neurosurgery.  Was rxed naproxen. Reports no new symptoms.  Some numbness down right leg.  No new tingling, weakness.    Takes high doses of aleve in the past which heps some, but has not taken any lately.    Left elbow pain:  Several weeks ago on a Monday, was lifting heavy boxes at work and the next day noticed soreness with swelling.  Feels pins and needles in left elbow area.  Has not tried any home OTCs, or icing.  History of hematuria: was asked to be evaluated by urology several years ago but did not complete workup due to uninsured status.  was found to have trichomonas last visit a year ago with some hematuria.  Reports compliance with treatment, denies any urinary symptoms or bleeding.      Review of Systemssee HPI     Objective:   Physical Exam GEN: Alert & Oriented, No acute distress CV:  Regular Rate & Rhythm, no murmur Respiratory:  Normal work of breathing, CTAB Abd:  + BS, soft, no tenderness to palpation Ext: no pre-tibial edema Back: ttp low back.  Patellar refelxes equal bilatrally. Normal gait. Left elbow: tendern over lateral epicondyl       Assessment & Plan:

## 2012-10-15 NOTE — Assessment & Plan Note (Addendum)
will check u/a today.  If hematuria noted without infection, will refer to urology for further workup  Update: + hematuria persists.  Will ask patient to schedule follow-up with his urologist

## 2012-10-18 ENCOUNTER — Telehealth: Payer: Self-pay | Admitting: *Deleted

## 2012-10-18 NOTE — Telephone Encounter (Signed)
Message copied by Damita Lack on Mon Oct 18, 2012  3:47 PM ------      Message from: Macy Mis      Created: Fri Oct 15, 2012  2:38 PM       Can you let patient know urine still shows small amount of blood so I recommend he call back his urologist to finish the workup started.  If he needs help or was so remote he needs another referral, I'd be glad to put an order in.  Thanks! ------

## 2012-10-18 NOTE — Telephone Encounter (Signed)
Spoke with Jonathon Cunningham.  Informed him of the small about of  Blood still in his urine and to follow up with his Urologist.  Advised him he should be able to call and schedule a follow up with the Urologist but if they tell him he needs a new referral to please let us know.  Patient states understanding.  Ileana Ladd

## 2012-11-16 ENCOUNTER — Encounter (HOSPITAL_COMMUNITY): Payer: Self-pay | Admitting: Emergency Medicine

## 2012-11-16 ENCOUNTER — Emergency Department (HOSPITAL_COMMUNITY)
Admission: EM | Admit: 2012-11-16 | Discharge: 2012-11-16 | Disposition: A | Discharge status: Home / Self Care | Payer: PRIVATE HEALTH INSURANCE | Attending: Emergency Medicine | Admitting: Emergency Medicine

## 2012-11-16 DIAGNOSIS — M545 Low back pain, unspecified: Secondary | ICD-10-CM | POA: Insufficient documentation

## 2012-11-16 DIAGNOSIS — G8929 Other chronic pain: Secondary | ICD-10-CM | POA: Insufficient documentation

## 2012-11-16 DIAGNOSIS — Z87891 Personal history of nicotine dependence: Secondary | ICD-10-CM | POA: Insufficient documentation

## 2012-11-16 DIAGNOSIS — Z87448 Personal history of other diseases of urinary system: Secondary | ICD-10-CM | POA: Insufficient documentation

## 2012-11-16 DIAGNOSIS — M549 Dorsalgia, unspecified: Secondary | ICD-10-CM

## 2012-11-16 DIAGNOSIS — Z8739 Personal history of other diseases of the musculoskeletal system and connective tissue: Secondary | ICD-10-CM | POA: Insufficient documentation

## 2012-11-16 HISTORY — DX: Other chronic pain: G89.29

## 2012-11-16 HISTORY — DX: Dorsalgia, unspecified: M54.9

## 2012-11-16 HISTORY — DX: Sciatica, unspecified side: M54.30

## 2012-11-16 HISTORY — DX: Hematuria, unspecified: R31.9

## 2012-11-16 MED ORDER — CYCLOBENZAPRINE HCL 5 MG PO TABS: 5.0000 mg | ORAL_TABLET | Freq: Two times a day (BID) | ORAL | Status: AC | PRN

## 2012-11-16 MED ORDER — IBUPROFEN 800 MG PO TABS: 800.0000 mg | ORAL_TABLET | Freq: Three times a day (TID) | ORAL | Status: AC

## 2012-11-16 MED ORDER — OXYCODONE-ACETAMINOPHEN 5-325 MG PO TABS
2.0000 | ORAL_TABLET | Freq: Once | ORAL | Status: AC
  Administered 2012-11-16: 2 via ORAL
  Filled 2012-11-16: qty 2

## 2012-11-16 MED ORDER — ONDANSETRON 4 MG PO TBDP
4.0000 mg | ORAL_TABLET | Freq: Once | ORAL | Status: AC
  Administered 2012-11-16: 4 mg via ORAL
  Filled 2012-11-16: qty 1

## 2012-11-16 NOTE — ED Provider Notes (Signed)
History     CSN: [id number]  Arrival date & time 11/16/12  3086   First MD Initiated Contact with Patient 11/16/12 0825      Chief Complaint  Patient presents with  . Back Pain    (Consider location/radiation/quality/duration/timing/severity/associated sxs/prior treatment) HPI  Jonathon Cunningham is a 52 y.o.male presenting to the ER with complaints of right low back pain exacerbation. It has been 1 week since it started and he has not tried anything at home for his pain. He has a history of chronic back pain that he has seen a neurosurgeon, Dr. Bettina Gavia for but since he has not had too much pain in the last two years he has not been back. He says the pain is on the right hip and then goes down his right leg without numbness, tingling or weakness. He works at [employer] and this past week has been much more physical than his normal week is. No bladder or bowel incontinence. He denies that this episode of pain is different from previous exacerbation and used to be on percocet daily but his PCP d/c'd narcotic pain medication. He is ambulatory without limp. No fevers, hx of IV drug use.    Past Medical History  Diagnosis Date  . Chronic back pain   . Hematuria   . Sciatic pain     History reviewed. No pertinent past surgical history.  No family history on file.  History  Substance Use Topics  . Smoking status: Former Games developer  . Smokeless tobacco: Not on file  . Alcohol Use: Yes      Review of Systems  Review of Systems  Gen: no weight loss, fevers, chills, night sweats  Eyes: no discharge or drainage, no occular pain or visual changes  Nose: no epistaxis or rhinorrhea  Mouth: no dental pain, no sore throat  Neck: no neck pain  Lungs:No wheezing, coughing or hemoptysis CV: no chest pain, palpitations, dependent edema or orthopnea  Abd: no abdominal pain, nausea, vomiting  GU: no dysuria or gross hematuria  MSK:  + back pain Neuro: no headache, no focal neurologic  deficits  Skin: no abnormalities Psyche: negative.   Allergies  Review of patient's allergies indicates no known allergies.  Home Medications   Current Outpatient Rx  Name  Route  Sig  Dispense  Refill  . cyclobenzaprine (FLEXERIL) 5 MG tablet   Oral   Take 1-2 tablets (5-10 mg total) by mouth 2 (two) times daily as needed for muscle spasms.   20 tablet   0   . ibuprofen (ADVIL,MOTRIN) 800 MG tablet   Oral   Take 1 tablet (800 mg total) by mouth 3 (three) times daily.   21 tablet   0     BP 133/97  Pulse 61  Temp(Src) 98.3 F (36.8 C) (Oral)  Resp 20  SpO2 100%  Physical Exam  Nursing note and vitals reviewed. Constitutional: He appears well-developed and well-nourished. No distress.  HENT:  Head: Normocephalic and atraumatic.  Eyes: Pupils are equal, round, and reactive to light.  Neck: Normal range of motion. Neck supple.  Cardiovascular: Normal rate and regular rhythm.   Pulmonary/Chest: Effort normal.  Abdominal: Soft.  Musculoskeletal:       Back:   Equal strength to bilateral lower extremities. Neurosensory function adequate to both legs. Skin color is normal. Skin is warm and moist. I see no step off deformity, no bony tenderness. Pt is able to ambulate without limp. Pain is relieved  when sitting in certain positions. ROM is decreased due to pain. No crepitus, laceration, effusion, swelling.  Pulses are normal   Neurological: He is alert.  Skin: Skin is warm and dry.    ED Course  Procedures (including critical care time)  Labs Reviewed - No data to display No results found.   1. Back pain       MDM  Patient with back pain. No neurological deficits. Patient is ambulatory. No warning symptoms of back pain including: loss of bowel or bladder control, night sweats, waking from sleep with back pain, unexplained fevers or weight loss, h/o cancer, IVDU, recent significant  trauma. No concern for cauda equina, epidural abscess, or other serious cause  of back pain. Conservative measures such as rest, ice/heat and pain medicine indicated with PCP follow-up if no improvement with conservative management.   Given 2 percocets and a Zofran in the ED then discharged with 800mg  Ibuprofen and Flexeril + work note for today.       Dorthula Matas, PA-C 11/16/12 [phone]

## 2012-11-16 NOTE — ED Notes (Signed)
Pt. Has a hx of lower back pain.  Last week his pain increased and the last 2 days the pain has become severe and he was unable to go to work.   Rt. Leg numbness noted, but pt. Reports the pain increases the numbness increases.  Pt. Had to miss work today.

## 2012-11-17 NOTE — ED Provider Notes (Signed)
Medical screening examination/treatment/procedure(s) were performed by non-physician practitioner and as supervising physician I was immediately available for consultation/collaboration.   Finnbar Cedillos M Muhsin Doris, DO 11/17/12 0949 

## 2012-12-13 IMAGING — CR DG KNEE COMPLETE 4+V*R*
4 series · 4 of 4 positions shown · non-contrast
Comparison: None.

CLINICAL DATA: Injury.  Pain.

RIGHT KNEE - COMPLETE 4+ VIEW

[t knee ap right]
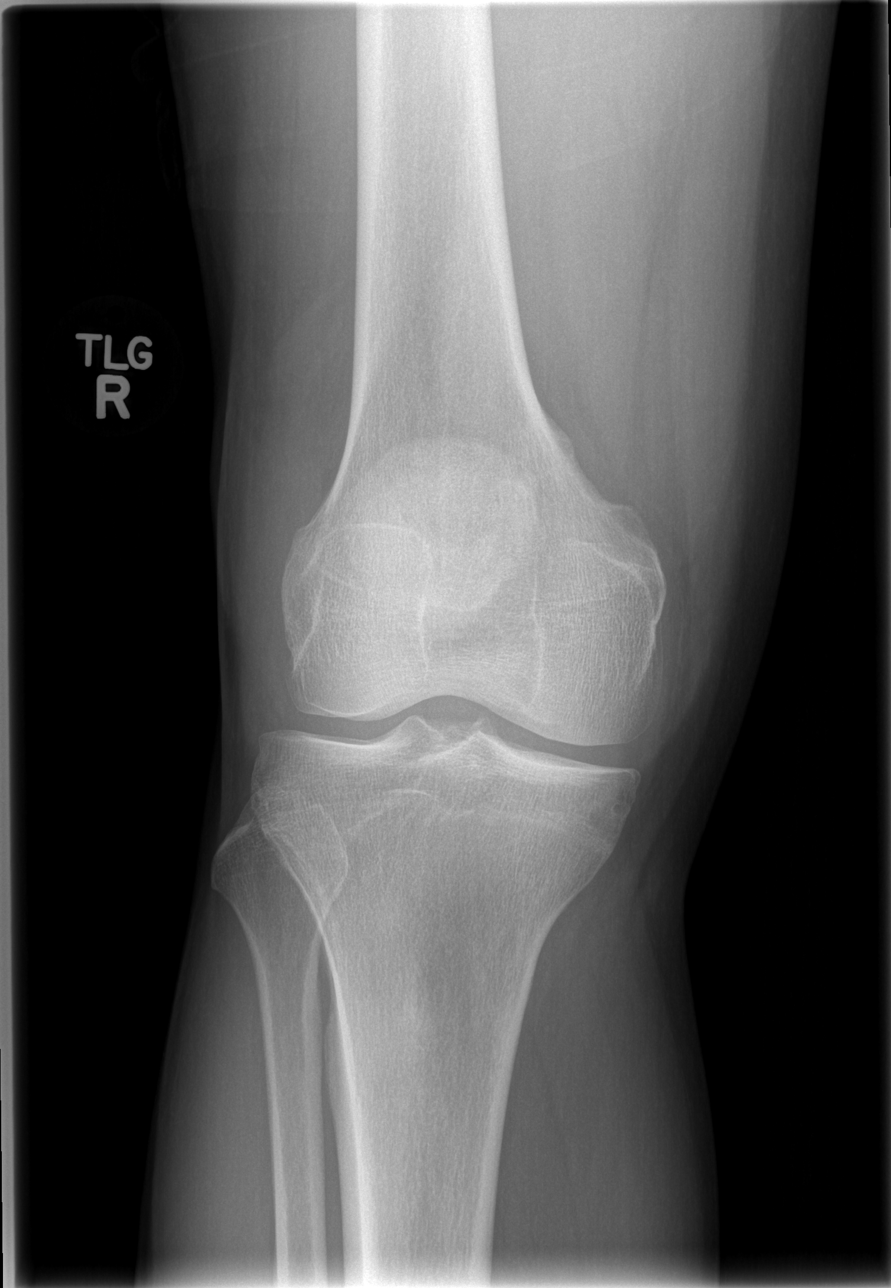

[t knee oblique right (1 of 2)]
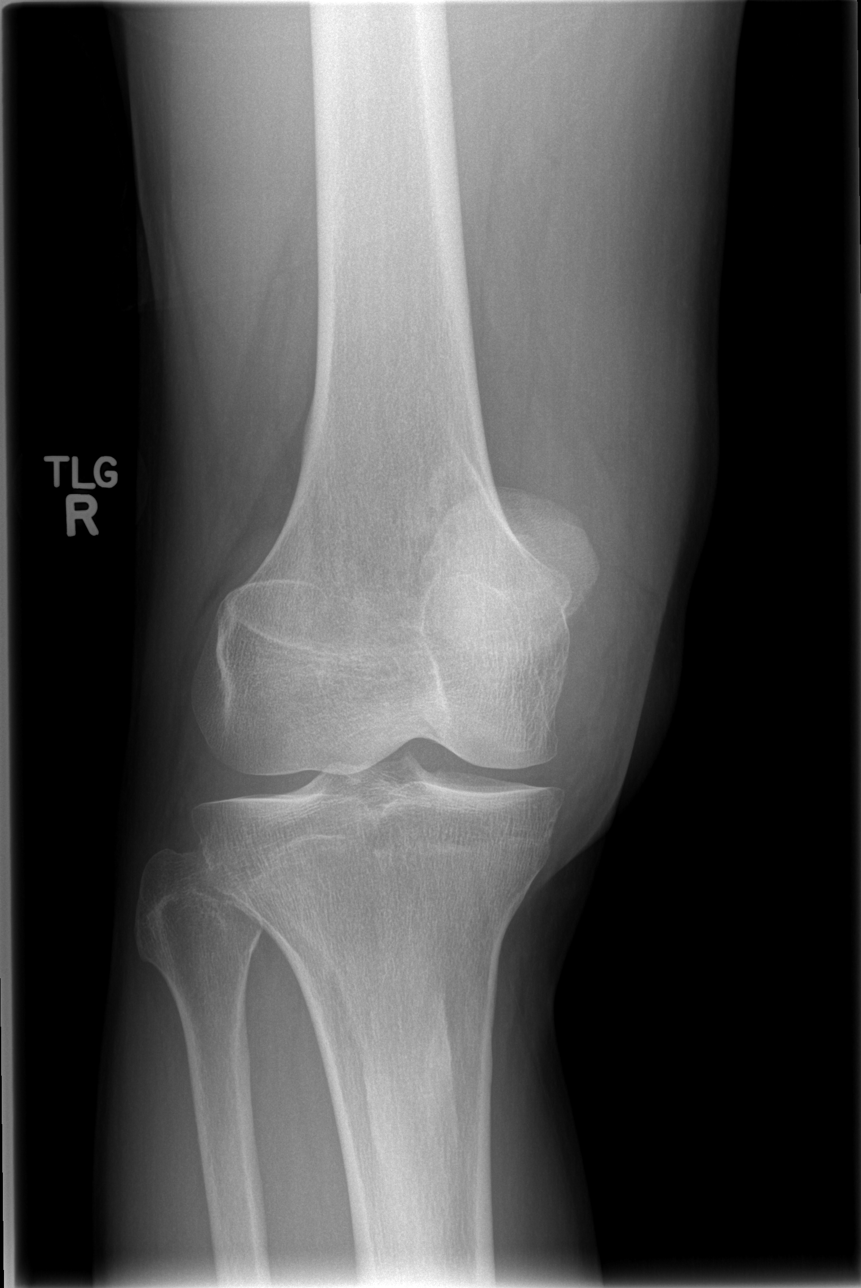

[t knee oblique right (2 of 2)]
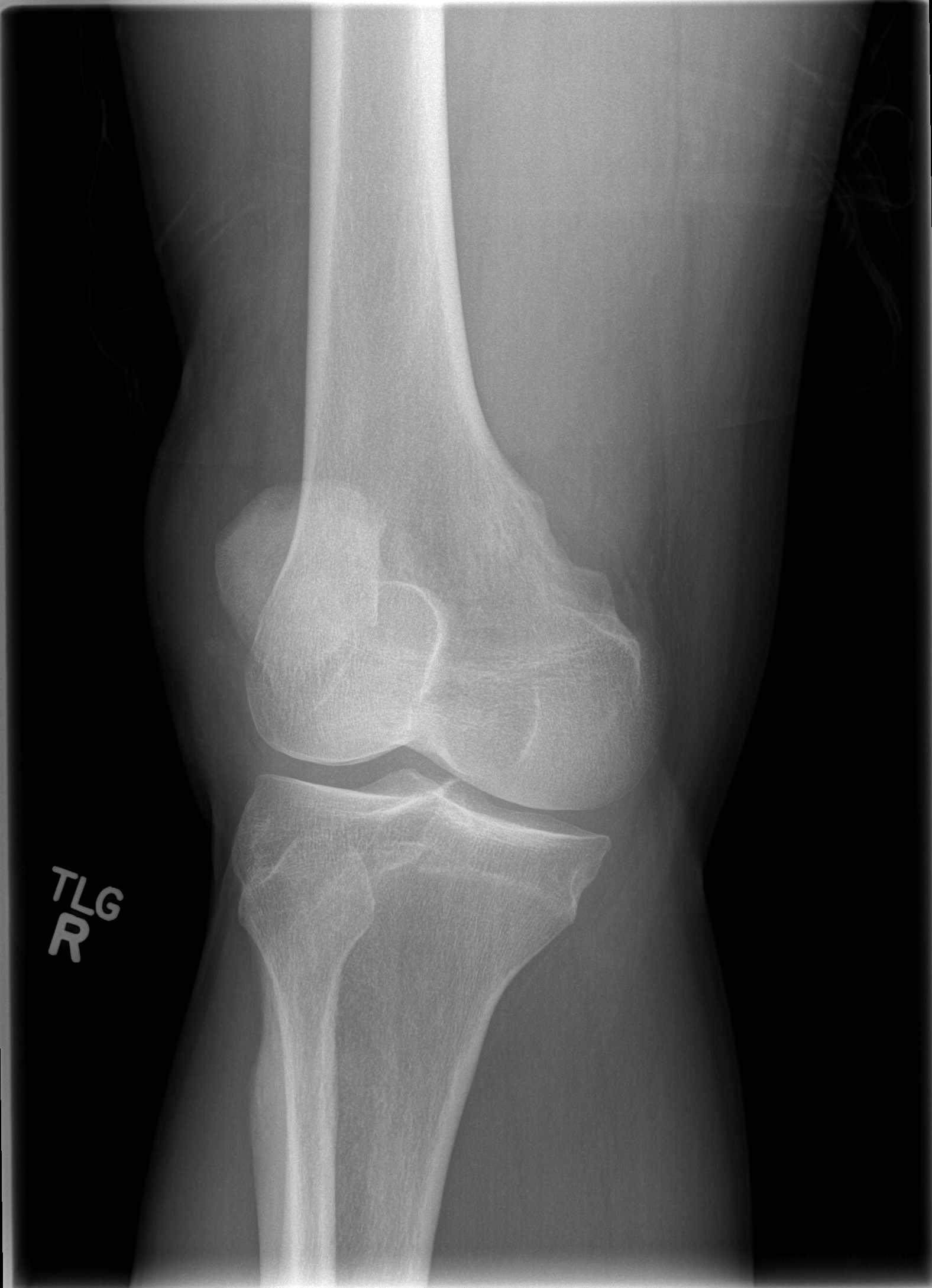

[t knee lat right]
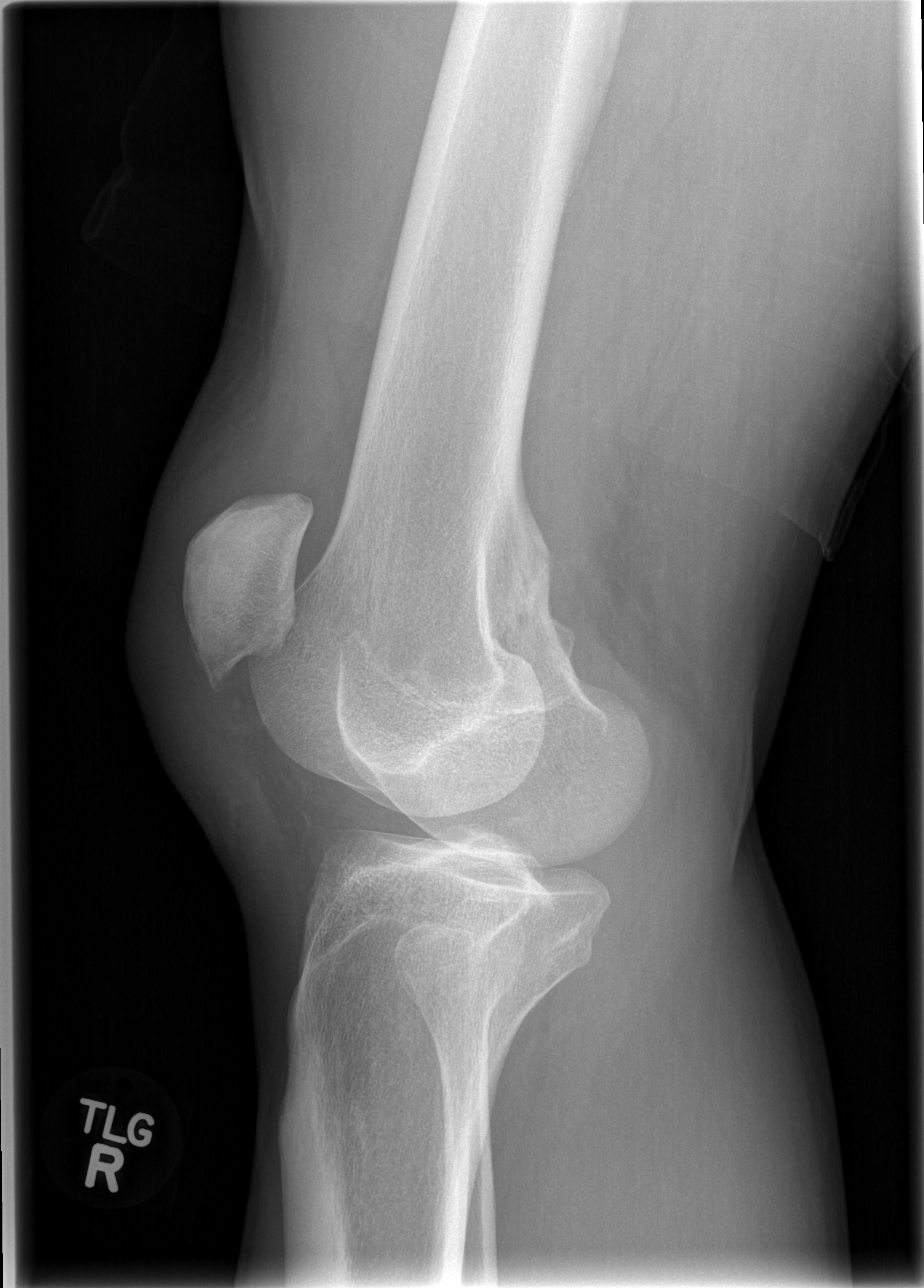

[4 of 4 positions shown; findings below may reference images not displayed]

FINDINGS: Lateral view is rotated limiting evaluation.

No fracture.

Prominent prepatellar soft tissue.  This may be related to rotation
although prepatellar bursitis or soft tissue injury not excluded.

The patella is high riding.  Injury to the patellar tendon not
excluded.
IMPRESSION: [Lateral view is rotated limiting evaluation.

No fracture.

Prominent prepatellar soft tissue.  This may be related to rotation
although prepatellar bursitis or soft tissue injury not excluded.

The patella is high riding.  Injury to the patellar tendon not
excluded.

## 2013-10-28 ENCOUNTER — Emergency Department (HOSPITAL_COMMUNITY)
Admission: EM | Admit: 2013-10-28 | Discharge: 2013-10-28 | Disposition: A | Discharge status: Home / Self Care | Payer: PRIVATE HEALTH INSURANCE | Source: Home / Self Care | Attending: Family Medicine | Admitting: Family Medicine

## 2013-10-28 ENCOUNTER — Encounter (HOSPITAL_COMMUNITY): Payer: Self-pay | Admitting: Emergency Medicine

## 2013-10-28 DIAGNOSIS — M549 Dorsalgia, unspecified: Secondary | ICD-10-CM

## 2013-10-28 MED ORDER — ETODOLAC 500 MG PO TABS: 500.0000 mg | ORAL_TABLET | Freq: Two times a day (BID) | ORAL | Status: AC

## 2013-10-28 MED ORDER — CYCLOBENZAPRINE HCL 10 MG PO TABS: 10.0000 mg | ORAL_TABLET | Freq: Three times a day (TID) | ORAL | Status: AC | PRN

## 2013-10-28 MED ORDER — HYDROCODONE-ACETAMINOPHEN 7.5-325 MG PO TABS: 1.0000 | ORAL_TABLET | ORAL | Status: AC | PRN

## 2013-10-28 NOTE — Discharge Instructions (Signed)
Chronic Back Pain   When back pain lasts longer than 3 months, it is called chronic back pain.People with chronic back pain often go through certain periods that are more intense (flare-ups).   CAUSES  Chronic back pain can be caused by wear and tear (degeneration) on different structures in your back. These structures include:   The bones of your spine (vertebrae) and the joints surrounding your spinal cord and nerve roots (facets).   The strong, fibrous tissues that connect your vertebrae (ligaments).  Degeneration of these structures may result in pressure on your nerves. This can lead to constant pain.  HOME CARE INSTRUCTIONS   Avoid bending, heavy lifting, prolonged sitting, and activities which make the problem worse.   Take brief periods of rest throughout the day to reduce your pain. Lying down or standing usually is better than sitting while you are resting.   Take over-the-counter or prescription medicines only as directed by your caregiver.  SEEK IMMEDIATE MEDICAL CARE IF:    You have weakness or numbness in one of your legs or feet.   You have trouble controlling your bladder or bowels.   You have nausea, vomiting, abdominal pain, shortness of breath, or fainting.  Document Released: 06/26/2004 Document Revised: 08/11/2011 Document Reviewed: 05/03/2011  Exit[employer] Patient Information 2014 [employer]

## 2013-10-28 NOTE — ED Provider Notes (Signed)
CSN: [id number]     Arrival date & time 10/28/13  1028 History   First MD Initiated Contact with Patient 10/28/13 1106     Chief Complaint  Patient presents with  . Back Pain   (Consider location/radiation/quality/duration/timing/severity/associated sxs/prior Treatment) HPI Comments: 53 year old male with chronic back pain presents complaining of worsening of his back pain. He was lifting something heavy at work on Monday. He did not have any immediate pain but the next morning he was in severe pain. This pain has persisted. He tried to work through it but this made worse. The pain is in his lower back in the midline. He admits to some numbness in his legs but this is only when he first wakes up, it is better when he stands up. He denies any loss of bowel or bladder control. Pain is severe and is made worse by any movement as well as by lying still. He says that he has an MRI in the computer from a couple of years ago father to look at it to see if the exact problem. He has had a neurosurgery consult, he has had surgery recommended to him but has not been ready to do that yet. He is thinking about getting some sort of laser spine surgery. 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   Past Medical History  Diagnosis Date  . Chronic back pain   . Hematuria   . Sciatic pain    History reviewed. No pertinent past surgical history. History reviewed. No pertinent family history. History  Substance Use Topics  . Smoking status: Former Games developermoker  . Smokeless tobacco: Not on file  . Alcohol Use: Yes    Review of Systems  Genitourinary:       No fecal or urinary incontinence  Musculoskeletal: Positive for back pain.  Neurological: Positive for weakness and numbness.  All other systems reviewed and are negative.   Allergies  Review of patient's allergies indicates no known allergies.  Home Medications   Prior to Admission medications   Medication Sig Start Date End Date  Taking? Authorizing Provider  cyclobenzaprine (FLEXERIL) 10 MG tablet Take 1 tablet (10 mg total) by mouth 3 (three) times daily as needed for muscle spasms. 10/28/13   Graylon GoodZachary H Ezell Poke, PA-C  cyclobenzaprine (FLEXERIL) 5 MG tablet Take 1-2 tablets (5-10 mg total) by mouth 2 (two) times daily as needed for muscle spasms. 11/16/12   Tiffany Irine SealG Greene, PA-C  etodolac (LODINE) 500 MG tablet Take 1 tablet (500 mg total) by mouth 2 (two) times daily. 10/28/13   Graylon GoodZachary H Taiesha Bovard, PA-C  HYDROcodone-acetaminophen (NORCO) 7.5-325 MG per tablet Take 1 tablet by mouth every 4 (four) hours as needed for moderate pain. 10/28/13   Graylon GoodZachary H Jontrell Bushong, PA-C  ibuprofen (ADVIL,MOTRIN) 800 MG tablet Take 1 tablet (800 mg total) by mouth 3 (three) times daily. 11/16/12   Tiffany Irine SealG Greene, PA-C   BP 129/94  Pulse 70  Temp(Src) 98.3 F (36.8 C) (Oral)  Resp 14  SpO2 100% Physical Exam  Nursing note and vitals reviewed. Constitutional: He is oriented to person, place, and time. He appears well-developed and well-nourished. No distress.  HENT:  Head: Normocephalic.  Pulmonary/Chest: Effort normal. No respiratory distress.  Musculoskeletal:       Lumbar back: He exhibits decreased range of motion, tenderness, bony tenderness, deformity (Step off at L4, he says this is chronic) and pain. He exhibits no swelling and no edema.  Neurological: He is  alert and oriented to person, place, and time. He has normal strength. No sensory deficit. Coordination and gait normal. GCS eye subscore is 4. GCS verbal subscore is 5. GCS motor subscore is 6.  Reflex Scores:      Patellar reflexes are 1+ on the right side and 1+ on the left side.      Achilles reflexes are 2+ on the right side and 2+ on the left side. Strength is equal bilaterally in the upper lower extremities. The patellar reflexes are decreased equally bilaterally, he says that they are always like this because of 2 remote knee injuries. The Achilles reflexes are normal and equal  bilaterally. Babinski is downgoing. He is able to walk on his heels with holding his toes off the ground and is able to walk up on his toes with his heels off the ground.  Skin: Skin is warm and dry. No rash noted. He is not diaphoretic.  Psychiatric: He has a normal mood and affect. Judgment normal.    ED Course  Procedures (including critical care time) Labs Review Labs Reviewed - No data to display  Imaging Review No results found.   MDM   1. Back pain    I reviewed the MRI in this patient does have very severe back problems. He has a chronic 1 cm anterolisthesis at the area of the step off. This is a flareup of her chronic back problem. He does not regularly take narcotic pain medications. Will prescribe a short course of Norco as well as anti-inflammatories and muscle relaxers. He will followup with neurosurgery if this does not get better.  Meds ordered this encounter  Medications  . HYDROcodone-acetaminophen (NORCO) 7.5-325 MG per tablet    Sig: Take 1 tablet by mouth every 4 (four) hours as needed for moderate pain.    Dispense:  15 tablet    Refill:  0    Order Specific Question:  Supervising Provider    Answer:  Linna Hoff [phone]  . etodolac (LODINE) 500 MG tablet    Sig: Take 1 tablet (500 mg total) by mouth 2 (two) times daily.    Dispense:  30 tablet    Refill:  0    Order Specific Question:  Supervising Provider    Answer:  Linna Hoff [phone]  . cyclobenzaprine (FLEXERIL) 10 MG tablet    Sig: Take 1 tablet (10 mg total) by mouth 3 (three) times daily as needed for muscle spasms.    Dispense:  30 tablet    Refill:  0    Order Specific Question:  Supervising Provider    Answer:  Bradd Canary D [[id number]]     Graylon Good, PA-C 10/28/13 2208

## 2013-10-28 NOTE — ED Notes (Signed)
C/o back pain which has been going on for years States for the last three days pain has been worst States he is unable to get up when he has laid down States he has consulted with laser spine PCP wants him to have surgery

## 2013-10-29 NOTE — ED Provider Notes (Signed)
Medical screening examination/treatment/procedure(s) were performed by resident physician or non-physician practitioner and as supervising physician I was immediately available for consultation/collaboration.   Barkley Bruns MD.   Linna Hoff, MD 10/29/13 [phone]

## 2013-12-11 ENCOUNTER — Encounter (HOSPITAL_COMMUNITY): Payer: Self-pay | Admitting: Emergency Medicine

## 2013-12-11 ENCOUNTER — Emergency Department (INDEPENDENT_AMBULATORY_CARE_PROVIDER_SITE_OTHER)
Admission: EM | Admit: 2013-12-11 | Discharge: 2013-12-11 | Disposition: A | Discharge status: Home / Self Care | Payer: PRIVATE HEALTH INSURANCE | Source: Home / Self Care | Attending: Family Medicine | Admitting: Family Medicine

## 2013-12-11 DIAGNOSIS — J020 Streptococcal pharyngitis: Secondary | ICD-10-CM

## 2013-12-11 LAB — POCT RAPID STREP A: Streptococcus, Group A Screen (Direct): POSITIVE — AB

## 2013-12-11 MED ORDER — PENICILLIN G BENZATHINE 1200000 UNIT/2ML IM SUSP
1.2000 10*6.[IU] | Freq: Once | INTRAMUSCULAR | Status: AC
  Administered 2013-12-11: 1.2 10*6.[IU] via INTRAMUSCULAR

## 2013-12-11 MED ORDER — PENICILLIN G BENZATHINE 1200000 UNIT/2ML IM SUSP
INTRAMUSCULAR | Status: AC
  Filled 2013-12-11: qty 2

## 2013-12-11 NOTE — Discharge Instructions (Signed)
Thank you for coming in today.  Strep Throat Strep throat is an infection of the throat caused by a bacteria named Streptococcus pyogenes. Your caregiver may call the infection streptococcal "tonsillitis" or "pharyngitis" depending on whether there are signs of inflammation in the tonsils or back of the throat. Strep throat is most common in children aged 53-15 years during the cold months of the year, but it can occur in people of any age during any season. This infection is spread from person to person (contagious) through coughing, sneezing, or other close contact. SYMPTOMS   Fever or chills.  Painful, swollen, red tonsils or throat.  Pain or difficulty when swallowing.  White or yellow spots on the tonsils or throat.  Swollen, tender lymph nodes or "glands" of the neck or under the jaw.  Red rash all over the body (rare). DIAGNOSIS  Many different infections can cause the same symptoms. A test must be done to confirm the diagnosis so the right treatment can be given. A "rapid strep test" can help your caregiver make the diagnosis in a few minutes. If this test is not available, a light swab of the infected area can be used for a throat culture test. If a throat culture test is done, results are usually available in a day or two. TREATMENT  Strep throat is treated with antibiotic medicine. HOME CARE INSTRUCTIONS   Gargle with 1 tsp of salt in 1 cup of warm water, 3-4 times per day or as needed for comfort.  Family members who also have a sore throat or fever should be tested for strep throat and treated with antibiotics if they have the strep infection.  Make sure everyone in your household washes their hands well.  Do not share food, drinking cups, or personal items that could cause the infection to spread to others.  You may need to eat a soft food diet until your sore throat gets better.  Drink enough water and fluids to keep your urine clear or pale yellow. This will help  prevent dehydration.  Get plenty of rest.  Stay home from school, daycare, or work until you have been on antibiotics for 24 hours.  Only take over-the-counter or prescription medicines for pain, discomfort, or fever as directed by your caregiver.  If antibiotics are prescribed, take them as directed. Finish them even if you start to feel better. SEEK MEDICAL CARE IF:   The glands in your neck continue to enlarge.  You develop a rash, cough, or earache.  You cough up green, yellow-brown, or bloody sputum.  You have pain or discomfort not controlled by medicines.  Your problems seem to be getting worse rather than better. SEEK IMMEDIATE MEDICAL CARE IF:   You develop any new symptoms such as vomiting, severe headache, stiff or painful neck, chest pain, shortness of breath, or trouble swallowing.  You develop severe throat pain, drooling, or changes in your voice.  You develop swelling of the neck, or the skin on the neck becomes red and tender.  You have a fever.  You develop signs of dehydration, such as fatigue, dry mouth, and decreased urination.  You become increasingly sleepy, or you cannot wake up completely. Document Released: 05/16/2000 Document Revised: 05/05/2012 Document Reviewed: 07/18/2010 [facility] Patient Information 2015 [employer] [place]. This information is not intended to replace advice given to you by your health care provider. Make sure you discuss any questions you have with your health care provider.

## 2013-12-11 NOTE — ED Provider Notes (Signed)
Francesco RunnerKenneth M Maple is a 53 y.o. male who presents to Urgent Care today for sore throat. Patient has 3 days of sore throat. No cough congestion runny nose. Patient notes fevers and chills and feels fatigued. No nausea vomiting or diarrhea. Chloraseptic spray and Advil have not helped much.   Past Medical History  Diagnosis Date  . Chronic back pain   . Hematuria   . Sciatic pain    History  Substance Use Topics  . Smoking status: Former Games developermoker  . Smokeless tobacco: Not on file  . Alcohol Use: Yes   ROS as above Medications: No current facility-administered medications for this encounter.   Current Outpatient Prescriptions  Medication Sig Dispense Refill  . cyclobenzaprine (FLEXERIL) 10 MG tablet Take 1 tablet (10 mg total) by mouth 3 (three) times daily as needed for muscle spasms.  30 tablet  0  . cyclobenzaprine (FLEXERIL) 5 MG tablet Take 1-2 tablets (5-10 mg total) by mouth 2 (two) times daily as needed for muscle spasms.  20 tablet  0  . etodolac (LODINE) 500 MG tablet Take 1 tablet (500 mg total) by mouth 2 (two) times daily.  30 tablet  0  . HYDROcodone-acetaminophen (NORCO) 7.5-325 MG per tablet Take 1 tablet by mouth every 4 (four) hours as needed for moderate pain.  15 tablet  0  . ibuprofen (ADVIL,MOTRIN) 800 MG tablet Take 1 tablet (800 mg total) by mouth 3 (three) times daily.  21 tablet  0    Exam:  BP 141/99  Pulse 104  Temp(Src) 98.7 F (37.1 C) (Oral)  Resp 18  SpO2 98% Gen: Well NAD HEENT: EOMI,  MMM tonsils with erythema and exudate Lungs: Normal work of breathing. CTABL Heart: RRR no MRG Abd: NABS, Soft. NT, ND Exts: Brisk capillary refill, warm and well perfused.   Results for orders placed during the hospital encounter of 12/11/13 (from the past 24 hour(s))  POCT RAPID STREP A ([facility] URG CARE ONLY)     Status: Abnormal   Collection Time    12/11/13  4:33 PM      Result Value Ref Range   Streptococcus, Group A Screen (Direct) POSITIVE (*) NEGATIVE   No  results found.  Assessment and Plan: 53 y.o. male with strep throat. Plan to treat with penicillin injection.  Discussed warning signs or symptoms. Please see discharge instructions. Patient expresses understanding.    Rodolph BongEvan S Jhanvi Drakeford, MD 12/11/13 [phone]

## 2013-12-11 NOTE — ED Notes (Signed)
C/o sore throat which started two days ago States he does have a ear ache and headache States OTC meds taking as tx

## 2014-04-03 ENCOUNTER — Emergency Department (HOSPITAL_COMMUNITY)
Admission: EM | Admit: 2014-04-03 | Discharge: 2014-04-03 | Disposition: A | Discharge status: Home / Self Care | Payer: PRIVATE HEALTH INSURANCE | Attending: Emergency Medicine | Admitting: Emergency Medicine

## 2014-04-03 ENCOUNTER — Encounter (HOSPITAL_COMMUNITY): Payer: Self-pay | Admitting: Emergency Medicine

## 2014-04-03 DIAGNOSIS — G8929 Other chronic pain: Secondary | ICD-10-CM | POA: Diagnosis not present

## 2014-04-03 DIAGNOSIS — M545 Low back pain: Secondary | ICD-10-CM | POA: Diagnosis not present

## 2014-04-03 DIAGNOSIS — Z87448 Personal history of other diseases of urinary system: Secondary | ICD-10-CM | POA: Insufficient documentation

## 2014-04-03 DIAGNOSIS — Z791 Long term (current) use of non-steroidal anti-inflammatories (NSAID): Secondary | ICD-10-CM | POA: Insufficient documentation

## 2014-04-03 DIAGNOSIS — M549 Dorsalgia, unspecified: Secondary | ICD-10-CM | POA: Diagnosis present

## 2014-04-03 DIAGNOSIS — Z87891 Personal history of nicotine dependence: Secondary | ICD-10-CM | POA: Insufficient documentation

## 2014-04-03 MED ORDER — TRAMADOL HCL 50 MG PO TABS: 50.0000 mg | ORAL_TABLET | Freq: Four times a day (QID) | ORAL | Status: AC | PRN

## 2014-04-03 MED ORDER — METHOCARBAMOL 500 MG PO TABS: 500.0000 mg | ORAL_TABLET | Freq: Two times a day (BID) | ORAL | Status: AC

## 2014-04-03 MED ORDER — KETOROLAC TROMETHAMINE 60 MG/2ML IM SOLN
60.0000 mg | Freq: Once | INTRAMUSCULAR | Status: AC
  Administered 2014-04-03: 60 mg via INTRAMUSCULAR
  Filled 2014-04-03: qty 2

## 2014-04-03 MED ORDER — OXYCODONE-ACETAMINOPHEN 5-325 MG PO TABS
2.0000 | ORAL_TABLET | Freq: Once | ORAL | Status: AC
  Administered 2014-04-03: 2 via ORAL
  Filled 2014-04-03: qty 2

## 2014-04-03 MED ORDER — DIAZEPAM 5 MG/ML IJ SOLN
5.0000 mg | Freq: Once | INTRAMUSCULAR | Status: AC
  Administered 2014-04-03: 5 mg via INTRAMUSCULAR
  Filled 2014-04-03: qty 2

## 2014-04-03 NOTE — ED Notes (Signed)
Pt here with c/o of chronic back pain. Worse today.

## 2014-04-03 NOTE — ED Provider Notes (Signed)
CSN: [id number]     Arrival date & time 04/03/14  1340 History  This chart was scribed for non-physician practitioner Sharilyn SitesLisa Noela Brothers working with Shon Batonourtney F Horton, MD by Littie Deedsichard Sun, ED Scribe. This patient was seen in room WTR5/WTR5 and the patient's care was started at 2:08 PM.      Chief Complaint  Patient presents with  . Back Pain     The history is provided by the patient. No language interpreter was used.   HPI Comments: Jonathon RunnerKenneth M Hammers is a 53 y.o. male with a hx of chronic back pain who presents to the Emergency Department complaining of severe back pain that radiates into his legs that worsened today. Patient states that his L4-L5 does not have any cartilage. He states that he cannot sit or stand due to the pain, and feels pain when just laying down. He has been taking percocet last prescribed 1 month ago by PCP. Patient has seen Dr. Newell CoralNudelman a few years ago; patient was recommended to have surgery, but he declined due to insurance. He notes that he does have insurance now. He denies any new injuries, trauma, or falls.  States baseline paresthesias of RLE, unchanged.  Denies numbness or weakness.  No loss of bowel or bladder control.  No fever, chills, urinary sx.  Past Medical History  Diagnosis Date  . Chronic back pain   . Hematuria   . Sciatic pain    History reviewed. No pertinent past surgical history. History reviewed. No pertinent family history. History  Substance Use Topics  . Smoking status: Former Games developermoker  . Smokeless tobacco: Not on file  . Alcohol Use: Yes    Review of Systems  Musculoskeletal: Positive for back pain.  All other systems reviewed and are negative.     Allergies  Review of patient's allergies indicates no known allergies.  Home Medications   Prior to Admission medications   Medication Sig Start Date End Date Taking? Authorizing Provider  cyclobenzaprine (FLEXERIL) 10 MG tablet Take 1 tablet (10 mg total) by mouth 3 (three) times daily  as needed for muscle spasms. 10/28/13   Graylon GoodZachary H Baker, PA-C  cyclobenzaprine (FLEXERIL) 5 MG tablet Take 1-2 tablets (5-10 mg total) by mouth 2 (two) times daily as needed for muscle spasms. 11/16/12   Tiffany Irine SealG Greene, PA-C  etodolac (LODINE) 500 MG tablet Take 1 tablet (500 mg total) by mouth 2 (two) times daily. 10/28/13   Graylon GoodZachary H Baker, PA-C  HYDROcodone-acetaminophen (NORCO) 7.5-325 MG per tablet Take 1 tablet by mouth every 4 (four) hours as needed for moderate pain. 10/28/13   Graylon GoodZachary H Baker, PA-C  ibuprofen (ADVIL,MOTRIN) 800 MG tablet Take 1 tablet (800 mg total) by mouth 3 (three) times daily. 11/16/12   Tiffany Irine SealG Greene, PA-C   BP 146/87 mmHg  Pulse 96  Temp(Src) 98.2 F (36.8 C) (Oral)  Resp 20  SpO2 100%   Physical Exam  Constitutional: He is oriented to person, place, and time. He appears well-developed and well-nourished.  Screaming and yelling loudly in room  HENT:  Head: Normocephalic and atraumatic.  Mouth/Throat: Oropharynx is clear and moist.  Eyes: Conjunctivae and EOM are normal. Pupils are equal, round, and reactive to light.  Neck: Normal range of motion.  Cardiovascular: Normal rate, regular rhythm and normal heart sounds.   Pulmonary/Chest: Effort normal and breath sounds normal.  Abdominal: Soft. Bowel sounds are normal.  Musculoskeletal: Normal range of motion. He exhibits tenderness.  Patient screaming in pain prior  to me touching his back; he has diffuse tenderness or his lumbar spine without gross deformity or signs of trauma; pain worse with movement of lower extremities; normal strength and sensation BLE  Neurological: He is alert and oriented to person, place, and time.  Skin: Skin is warm and dry.  Psychiatric: He has a normal mood and affect.  Nursing note and vitals reviewed.   ED Course  Procedures  DIAGNOSTIC STUDIES: Oxygen Saturation is 100% on room air, normal by my interpretation.    COORDINATION OF CARE: 2:12 PM-Discussed treatment  plan which includes medication with pt at bedside and pt agreed to plan.    Labs Review Labs Reviewed - No data to display  Imaging Review No results found.   EKG Interpretation None      MDM   Final diagnoses:  Chronic back pain   53 year old male with chronic back pain. States pain acutely worse today without recent injury, trauma, or fall. Prior MRI from 2008 was reviewed.  On exam, patient is yelling and screaming loudly prior to me even touching his back. He notes subjective paresthesias of right leg which he has at baseline, no focal neurologic deficits on exam today.  Patient given toradol, valium, and percocet in the ED however continues yelling loudly in pain.  I have discussed with him that his pain is chronic and we are unlikely to completely resolve it in the ED today.  Patient was able to transfer from bed to wheelchair and was assisted out to car by ED staff.  Rx tramadol and robaxin.  I have encouraged him to FU with his PCP and/or neurosurgery.  Discussed plan with patient, he/she acknowledged understanding and agreed with plan of care.  Return precautions given for new or worsening symptoms.  I personally performed the services described in this documentation, which was scribed in my presence. The recorded information has been reviewed and is accurate.  Garlon HatchetLisa M Nikola Marone, PA-C 04/03/14 1709  Garlon HatchetLisa M Airyanna Dipalma, PA-C 04/03/14 [phone]

## 2014-04-03 NOTE — Discharge Instructions (Signed)
Take the prescribed medication as directed for back pain. Follow-up with your primary care physician.  Would also recommend following up with Dr. Newell CoralNudelman. Return to the ED for new or worsening symptoms.

## 2014-04-03 NOTE — ED Notes (Signed)
Bed: WTR5 Expected date:  Expected time:  Means of arrival:  Comments: Chronic back pain

## 2014-04-06 ENCOUNTER — Inpatient Hospital Stay (HOSPITAL_COMMUNITY): Payer: PRIVATE HEALTH INSURANCE

## 2014-04-06 ENCOUNTER — Other Ambulatory Visit: Payer: Self-pay

## 2014-04-06 ENCOUNTER — Encounter (HOSPITAL_COMMUNITY): Payer: Self-pay

## 2014-04-06 ENCOUNTER — Inpatient Hospital Stay (HOSPITAL_COMMUNITY)
Admission: EM | Admit: 2014-04-06 | Discharge: 2014-05-02 | DRG: 871 | Disposition: E | Payer: PRIVATE HEALTH INSURANCE | Attending: Pulmonary Disease | Admitting: Pulmonary Disease

## 2014-04-06 ENCOUNTER — Emergency Department (HOSPITAL_COMMUNITY): Payer: PRIVATE HEALTH INSURANCE

## 2014-04-06 DIAGNOSIS — E860 Dehydration: Secondary | ICD-10-CM | POA: Diagnosis present

## 2014-04-06 DIAGNOSIS — A400 Sepsis due to streptococcus, group A: Principal | ICD-10-CM | POA: Diagnosis present

## 2014-04-06 DIAGNOSIS — E861 Hypovolemia: Secondary | ICD-10-CM | POA: Diagnosis present

## 2014-04-06 DIAGNOSIS — E872 Acidosis, unspecified: Secondary | ICD-10-CM | POA: Diagnosis present

## 2014-04-06 DIAGNOSIS — M549 Dorsalgia, unspecified: Secondary | ICD-10-CM | POA: Diagnosis present

## 2014-04-06 DIAGNOSIS — R471 Dysarthria and anarthria: Secondary | ICD-10-CM | POA: Diagnosis present

## 2014-04-06 DIAGNOSIS — N179 Acute kidney failure, unspecified: Secondary | ICD-10-CM

## 2014-04-06 DIAGNOSIS — J9601 Acute respiratory failure with hypoxia: Secondary | ICD-10-CM | POA: Diagnosis present

## 2014-04-06 DIAGNOSIS — G8929 Other chronic pain: Secondary | ICD-10-CM | POA: Diagnosis present

## 2014-04-06 DIAGNOSIS — R740 Nonspecific elevation of levels of transaminase and lactic acid dehydrogenase [LDH]: Secondary | ICD-10-CM | POA: Diagnosis present

## 2014-04-06 DIAGNOSIS — Z9889 Other specified postprocedural states: Secondary | ICD-10-CM

## 2014-04-06 DIAGNOSIS — I426 Alcoholic cardiomyopathy: Secondary | ICD-10-CM | POA: Diagnosis present

## 2014-04-06 DIAGNOSIS — G92 Toxic encephalopathy: Secondary | ICD-10-CM | POA: Diagnosis present

## 2014-04-06 DIAGNOSIS — E875 Hyperkalemia: Secondary | ICD-10-CM | POA: Diagnosis present

## 2014-04-06 DIAGNOSIS — D696 Thrombocytopenia, unspecified: Secondary | ICD-10-CM | POA: Diagnosis present

## 2014-04-06 DIAGNOSIS — N170 Acute kidney failure with tubular necrosis: Secondary | ICD-10-CM | POA: Diagnosis present

## 2014-04-06 DIAGNOSIS — R4182 Altered mental status, unspecified: Secondary | ICD-10-CM

## 2014-04-06 DIAGNOSIS — R001 Bradycardia, unspecified: Secondary | ICD-10-CM | POA: Diagnosis present

## 2014-04-06 DIAGNOSIS — T510X1A Toxic effect of ethanol, accidental (unintentional), initial encounter: Secondary | ICD-10-CM | POA: Diagnosis present

## 2014-04-06 DIAGNOSIS — R570 Cardiogenic shock: Secondary | ICD-10-CM

## 2014-04-06 DIAGNOSIS — I469 Cardiac arrest, cause unspecified: Secondary | ICD-10-CM | POA: Diagnosis not present

## 2014-04-06 DIAGNOSIS — R579 Shock, unspecified: Secondary | ICD-10-CM

## 2014-04-06 DIAGNOSIS — M6282 Rhabdomyolysis: Secondary | ICD-10-CM | POA: Diagnosis present

## 2014-04-06 DIAGNOSIS — Z01818 Encounter for other preprocedural examination: Secondary | ICD-10-CM

## 2014-04-06 DIAGNOSIS — Z87891 Personal history of nicotine dependence: Secondary | ICD-10-CM

## 2014-04-06 DIAGNOSIS — R092 Respiratory arrest: Secondary | ICD-10-CM | POA: Diagnosis not present

## 2014-04-06 DIAGNOSIS — F10239 Alcohol dependence with withdrawal, unspecified: Secondary | ICD-10-CM | POA: Diagnosis present

## 2014-04-06 DIAGNOSIS — R6521 Severe sepsis with septic shock: Secondary | ICD-10-CM | POA: Diagnosis present

## 2014-04-06 DIAGNOSIS — G9340 Encephalopathy, unspecified: Secondary | ICD-10-CM

## 2014-04-06 DIAGNOSIS — E871 Hypo-osmolality and hyponatremia: Secondary | ICD-10-CM | POA: Diagnosis present

## 2014-04-06 DIAGNOSIS — K7010 Alcoholic hepatitis without ascites: Secondary | ICD-10-CM | POA: Diagnosis present

## 2014-04-06 DIAGNOSIS — R680 Hypothermia, not associated with low environmental temperature: Secondary | ICD-10-CM | POA: Diagnosis present

## 2014-04-06 DIAGNOSIS — I5021 Acute systolic (congestive) heart failure: Secondary | ICD-10-CM

## 2014-04-06 DIAGNOSIS — R0600 Dyspnea, unspecified: Secondary | ICD-10-CM

## 2014-04-06 DIAGNOSIS — A419 Sepsis, unspecified organism: Secondary | ICD-10-CM

## 2014-04-06 DIAGNOSIS — M6080 Other myositis, unspecified site: Secondary | ICD-10-CM | POA: Diagnosis present

## 2014-04-06 LAB — COMPREHENSIVE METABOLIC PANEL
ALBUMIN: 2.2 g/dL — AB (ref 3.5–5.2)
ALK PHOS: 132 U/L — AB (ref 39–117)
ALK PHOS: 110 U/L (ref 39–117)
ALT: 66 U/L — AB (ref 0–53)
ALT: 109 U/L — AB (ref 0–53)
AST: 234 U/L — AB (ref 0–37)
AST: 479 U/L — ABNORMAL HIGH (ref 0–37)
Albumin: 1.6 g/dL — ABNORMAL LOW (ref 3.5–5.2)
Anion gap: 37 — ABNORMAL HIGH (ref 5–15)
Anion gap: 24 — ABNORMAL HIGH (ref 5–15)
BUN: 53 mg/dL — ABNORMAL HIGH (ref 6–23)
BUN: 69 mg/dL — ABNORMAL HIGH (ref 6–23)
CALCIUM: 9.4 mg/dL (ref 8.4–10.5)
CO2: 11 mEq/L — ABNORMAL LOW (ref 19–32)
CO2: 11 meq/L — AB (ref 19–32)
Calcium: 6.8 mg/dL — ABNORMAL LOW (ref 8.4–10.5)
Chloride: 84 mEq/L — ABNORMAL LOW (ref 96–112)
Chloride: 91 mEq/L — ABNORMAL LOW (ref 96–112)
Creatinine, Ser: 3.11 mg/dL — ABNORMAL HIGH (ref 0.50–1.35)
Creatinine, Ser: 3.27 mg/dL — ABNORMAL HIGH (ref 0.50–1.35)
GFR calc Af Amer: 25 mL/min — ABNORMAL LOW (ref >90)
GFR calc Af Amer: 23 mL/min — ABNORMAL LOW (ref >90)
GFR calc non Af Amer: 21 mL/min — ABNORMAL LOW (ref >90)
GFR, EST NON AFRICAN AMERICAN: 20 mL/min — AB (ref >90)
GLUCOSE: 101 mg/dL — AB (ref 70–99)
GLUCOSE: 94 mg/dL (ref 70–99)
POTASSIUM: 4.8 meq/L (ref 3.7–5.3)
SODIUM: 132 meq/L — AB (ref 137–147)
SODIUM: 126 meq/L — AB (ref 137–147)
TOTAL PROTEIN: 5.5 g/dL — AB (ref 6.0–8.3)
Total Bilirubin: 2 mg/dL — ABNORMAL HIGH (ref 0.3–1.2)
Total Bilirubin: 1.6 mg/dL — ABNORMAL HIGH (ref 0.3–1.2)
Total Protein: 7.5 g/dL (ref 6.0–8.3)

## 2014-04-06 LAB — CBC
HEMATOCRIT: 53.4 % — AB (ref 39.0–52.0)
Hemoglobin: 18.4 g/dL — ABNORMAL HIGH (ref 13.0–17.0)
MCH: 29.8 pg (ref 26.0–34.0)
MCHC: 34.5 g/dL (ref 30.0–36.0)
MCV: 86.5 fL (ref 78.0–100.0)
Platelets: 62 10*3/uL — ABNORMAL LOW (ref 150–400)
RBC: 6.17 MIL/uL — ABNORMAL HIGH (ref 4.22–5.81)
RDW: 13.6 % (ref 11.5–15.5)
WBC: 24.1 10*3/uL — ABNORMAL HIGH (ref 4.0–10.5)

## 2014-04-06 LAB — TROPONIN I: Troponin I: <0.3 ng/mL (ref <0.30)

## 2014-04-06 LAB — I-STAT CHEM 8, ED
BUN: 53 mg/dL — ABNORMAL HIGH (ref 6–23)
CREATININE: 3.9 mg/dL — AB (ref 0.50–1.35)
Calcium, Ion: 1.01 mmol/L — ABNORMAL LOW (ref 1.12–1.23)
Chloride: 95 mEq/L — ABNORMAL LOW (ref 96–112)
Glucose, Bld: 101 mg/dL — ABNORMAL HIGH (ref 70–99)
HCT: 60 % — ABNORMAL HIGH (ref 39.0–52.0)
Hemoglobin: 20.4 g/dL — ABNORMAL HIGH (ref 13.0–17.0)
Potassium: 5.3 mEq/L (ref 3.7–5.3)
Sodium: 127 mEq/L — ABNORMAL LOW (ref 137–147)
TCO2: 16 mmol/L (ref 0–100)

## 2014-04-06 LAB — URINALYSIS, ROUTINE W REFLEX MICROSCOPIC
GLUCOSE, UA: NEGATIVE mg/dL
Ketones, ur: 15 mg/dL — AB
Nitrite: POSITIVE — AB
PH: 5 (ref 5.0–8.0)
Protein, ur: 100 mg/dL — AB
Specific Gravity, Urine: 1.024 (ref 1.005–1.030)
Urobilinogen, UA: 1 mg/dL (ref 0.0–1.0)

## 2014-04-06 LAB — SALICYLATE LEVEL

## 2014-04-06 LAB — APTT: APTT: 31 s (ref 24–37)

## 2014-04-06 LAB — URINE MICROSCOPIC-ADD ON

## 2014-04-06 LAB — BASIC METABOLIC PANEL
ANION GAP: 22 — AB (ref 5–15)
BUN: 57 mg/dL — ABNORMAL HIGH (ref 6–23)
CALCIUM: 5.9 mg/dL — AB (ref 8.4–10.5)
CO2: 10 mEq/L — CL (ref 19–32)
CREATININE: 2.77 mg/dL — AB (ref 0.50–1.35)
Chloride: 98 mEq/L (ref 96–112)
GFR calc non Af Amer: 25 mL/min — ABNORMAL LOW (ref >90)
GFR, EST AFRICAN AMERICAN: 28 mL/min — AB (ref >90)
Glucose, Bld: 81 mg/dL (ref 70–99)
Potassium: 4.6 mEq/L (ref 3.7–5.3)
Sodium: 130 mEq/L — ABNORMAL LOW (ref 137–147)

## 2014-04-06 LAB — CK: Total CK: 8389 U/L — ABNORMAL HIGH (ref 7–232)

## 2014-04-06 LAB — CBG MONITORING, ED: GLUCOSE-CAPILLARY: 108 mg/dL — AB (ref 70–99)

## 2014-04-06 LAB — I-STAT TROPONIN, ED: Troponin i, poc: 0 ng/mL (ref 0.00–0.08)

## 2014-04-06 LAB — POCT I-STAT 3, ART BLOOD GAS (G3+)
Acid-base deficit: 12 mmol/L — ABNORMAL HIGH (ref 0.0–2.0)
BICARBONATE: 13.9 meq/L — AB (ref 20.0–24.0)
O2 Saturation: 95 %
PH ART: 7.257 — AB (ref 7.350–7.450)
TCO2: 15 mmol/L (ref 0–100)
pCO2 arterial: 31.2 mmHg — ABNORMAL LOW (ref 35.0–45.0)
pO2, Arterial: 88 mmHg (ref 80.0–100.0)

## 2014-04-06 LAB — DIFFERENTIAL
BASOS ABS: 0 10*3/uL (ref 0.0–0.1)
Basophils Relative: 0 % (ref 0–1)
Eosinophils Absolute: 0.2 10*3/uL (ref 0.0–0.7)
Eosinophils Relative: 1 % (ref 0–5)
LYMPHS PCT: 4 % — AB (ref 12–46)
Lymphs Abs: 1 10*3/uL (ref 0.7–4.0)
MONOS PCT: 3 % (ref 3–12)
Monocytes Absolute: 0.7 10*3/uL (ref 0.1–1.0)
NEUTROS ABS: 22.2 10*3/uL — AB (ref 1.7–7.7)
NEUTROS PCT: 92 % — AB (ref 43–77)
WBC MORPHOLOGY: INCREASED

## 2014-04-06 LAB — PROTIME-INR
INR: 1.04 (ref 0.00–1.49)
Prothrombin Time: 13.7 seconds (ref 11.6–15.2)

## 2014-04-06 LAB — PROCALCITONIN: Procalcitonin: 97.12 ng/mL

## 2014-04-06 LAB — LACTIC ACID, PLASMA
Lactic Acid, Venous: 5.5 mmol/L — ABNORMAL HIGH (ref 0.5–2.2)
Lactic Acid, Venous: 4.4 mmol/L — ABNORMAL HIGH (ref 0.5–2.2)

## 2014-04-06 LAB — I-STAT CG4 LACTIC ACID, ED: Lactic Acid, Venous: 13.26 mmol/L — ABNORMAL HIGH (ref 0.5–2.2)

## 2014-04-06 LAB — ETHANOL: Alcohol, Ethyl (B): <11 mg/dL (ref 0–11)

## 2014-04-06 LAB — PRO B NATRIURETIC PEPTIDE: PRO B NATRI PEPTIDE: 27258 pg/mL — AB (ref 0–125)

## 2014-04-06 LAB — MRSA PCR SCREENING: MRSA by PCR: NEGATIVE

## 2014-04-06 LAB — TSH: TSH: 2.13 u[IU]/mL (ref 0.350–4.500)

## 2014-04-06 MED ORDER — PIPERACILLIN-TAZOBACTAM 3.375 G IVPB
3.3750 g | Freq: Three times a day (TID) | INTRAVENOUS | Status: DC
  Filled 2014-04-06 (×3): qty 50

## 2014-04-06 MED ORDER — FENTANYL CITRATE 0.05 MG/ML IJ SOLN
50.0000 ug | Freq: Once | INTRAMUSCULAR | Status: AC
  Administered 2014-04-06: 100 ug via INTRAVENOUS

## 2014-04-06 MED ORDER — SODIUM CHLORIDE 0.9 % IV SOLN
1.0000 g | Freq: Once | INTRAVENOUS | Status: DC
  Filled 2014-04-06: qty 10

## 2014-04-06 MED ORDER — FENTANYL CITRATE 0.05 MG/ML IJ SOLN: 50.0000 ug | Freq: Once | INTRAMUSCULAR | Status: DC

## 2014-04-06 MED ORDER — SODIUM CHLORIDE 0.9 % IV BOLUS (SEPSIS)
1000.0000 mL | Freq: Once | INTRAVENOUS | Status: AC
  Administered 2014-04-06: 1000 mL via INTRAVENOUS

## 2014-04-06 MED ORDER — EPINEPHRINE HCL 0.1 MG/ML IJ SOSY
PREFILLED_SYRINGE | INTRAMUSCULAR | Status: AC
  Filled 2014-04-06: qty 10

## 2014-04-06 MED ORDER — MIDAZOLAM HCL 2 MG/2ML IJ SOLN: 2.0000 mg | INTRAMUSCULAR | Status: DC | PRN

## 2014-04-06 MED ORDER — STERILE WATER FOR INJECTION IV SOLN
INTRAVENOUS | Status: DC
  Administered 2014-04-06: 18:00:00 via INTRAVENOUS
  Filled 2014-04-06 (×3): qty 850

## 2014-04-06 MED ORDER — TENECTEPLASE 50 MG IV KIT
50.0000 mg | PACK | INTRAVENOUS | Status: DC
  Filled 2014-04-06: qty 10

## 2014-04-06 MED ORDER — VANCOMYCIN HCL 10 G IV SOLR
1250.0000 mg | Freq: Once | INTRAVENOUS | Status: DC
Start: 2014-04-06 — End: 2014-04-07
  Filled 2014-04-06: qty 1250

## 2014-04-06 MED ORDER — METOPROLOL TARTRATE 1 MG/ML IV SOLN: 2.5000 mg | INTRAVENOUS | Status: DC | PRN

## 2014-04-06 MED ORDER — SODIUM BICARBONATE 8.4 % IV SOLN
INTRAVENOUS | Status: AC
  Filled 2014-04-06: qty 100

## 2014-04-06 MED ORDER — SODIUM CHLORIDE 0.9 % IV BOLUS (SEPSIS)
1000.0000 mL | Freq: Once | INTRAVENOUS | Status: AC
Start: 2014-04-06 — End: 2014-04-06
  Administered 2014-04-06: 1000 mL via INTRAVENOUS

## 2014-04-06 MED ORDER — LORAZEPAM 2 MG/ML IJ SOLN: 1.0000 mg | INTRAMUSCULAR | Status: DC | PRN

## 2014-04-06 MED ORDER — NOREPINEPHRINE BITARTRATE 1 MG/ML IV SOLN
0.0000 ug/min | INTRAVENOUS | Status: DC
  Administered 2014-04-06: 30 ug/min via INTRAVENOUS
  Filled 2014-04-06: qty 16

## 2014-04-06 MED ORDER — FENTANYL CITRATE 0.05 MG/ML IJ SOLN
25.0000 ug | INTRAMUSCULAR | Status: DC | PRN
  Filled 2014-04-06 (×2): qty 2

## 2014-04-06 MED ORDER — CETYLPYRIDINIUM CHLORIDE 0.05 % MT LIQD: 7.0000 mL | Freq: Two times a day (BID) | OROMUCOSAL | Status: DC

## 2014-04-06 MED ORDER — DEXMEDETOMIDINE HCL IN NACL 200 MCG/50ML IV SOLN
0.2000 ug/kg/h | INTRAVENOUS | Status: DC
  Filled 2014-04-06: qty 50

## 2014-04-06 MED ORDER — SODIUM CHLORIDE 0.9 % IV BOLUS (SEPSIS): 1000.0000 mL | Freq: Once | INTRAVENOUS | Status: DC

## 2014-04-06 MED ORDER — SODIUM CHLORIDE 0.9 % IV SOLN: 0.0000 ug/h | INTRAVENOUS | Status: DC

## 2014-04-06 MED ORDER — SODIUM CHLORIDE 0.9 % IV SOLN: INTRAVENOUS | Status: DC

## 2014-04-06 MED ORDER — VANCOMYCIN HCL IN DEXTROSE 750-5 MG/150ML-% IV SOLN: 750.0000 mg | INTRAVENOUS | Status: DC

## 2014-04-06 MED ORDER — FOLIC ACID 5 MG/ML IJ SOLN
1.0000 mg | Freq: Every day | INTRAMUSCULAR | Status: DC
  Administered 2014-04-06: 1 mg via INTRAVENOUS
  Filled 2014-04-06: qty 0.2

## 2014-04-06 MED ORDER — MIDAZOLAM HCL 2 MG/2ML IJ SOLN
INTRAMUSCULAR | Status: AC
  Filled 2014-04-06: qty 2

## 2014-04-06 MED ORDER — LORAZEPAM 2 MG/ML IJ SOLN
2.0000 mg | Freq: Once | INTRAMUSCULAR | Status: AC
  Administered 2014-04-06: 2 mg via INTRAVENOUS
  Filled 2014-04-06: qty 1

## 2014-04-06 MED ORDER — SODIUM BICARBONATE 8.4 % IV SOLN
INTRAVENOUS | Status: AC
  Filled 2014-04-06: qty 50

## 2014-04-06 MED ORDER — FENTANYL BOLUS VIA INFUSION
50.0000 ug | INTRAVENOUS | Status: DC | PRN
  Filled 2014-04-06: qty 100

## 2014-04-06 MED ORDER — THIAMINE HCL 100 MG/ML IJ SOLN
500.0000 mg | INTRAVENOUS | Status: DC
  Administered 2014-04-06: 500 mg via INTRAVENOUS
  Filled 2014-04-06: qty 5

## 2014-04-06 MED ORDER — SODIUM CHLORIDE 0.9 % IV SOLN: 250.0000 mL | INTRAVENOUS | Status: DC | PRN

## 2014-04-06 MED ORDER — CHLORHEXIDINE GLUCONATE 0.12 % MT SOLN
15.0000 mL | Freq: Two times a day (BID) | OROMUCOSAL | Status: DC
  Filled 2014-04-06: qty 15

## 2014-04-06 MED ORDER — FENTANYL BOLUS VIA INFUSION: 50.0000 ug | INTRAVENOUS | Status: DC | PRN

## 2014-04-06 MED ORDER — DEXTROSE 50 % IV SOLN
INTRAVENOUS | Status: AC
  Filled 2014-04-06: qty 50

## 2014-04-06 MED ORDER — SODIUM CHLORIDE 0.9 % IV SOLN
0.0000 ug/h | INTRAVENOUS | Status: DC
  Administered 2014-04-06: 25 ug/h via INTRAVENOUS
  Filled 2014-04-06: qty 50

## 2014-04-06 NOTE — ED Notes (Signed)
Rectal temperature 99.4

## 2014-04-06 NOTE — Procedures (Signed)
Arterial Catheter Insertion Procedure Note Jonathon Cunningham [id number] August 25, 1960  Procedure: Insertion of Arterial Catheter  Indications: Blood pressure monitoring and Frequent blood sampling  Procedure Details Consent: Unable to obtain consent because of emergent medical necessity. Time Out: Verified patient identification, verified procedure, site/side was marked, verified correct patient position, special equipment/implants available, medications/allergies/relevent history reviewed, required imaging and test results available.  Performed  Maximum sterile technique was used including antiseptics, cap, gloves, gown, hand hygiene, mask and sheet. Skin prep: Chlorhexidine; local anesthetic administered 20 gauge catheter was inserted into left femoral artery using the Seldinger technique.  Evaluation Blood flow good; BP tracing good. Complications: No apparent complications.  Procedure performed under direct ultrasound guidance for real time vessel cannulation.      Rutherford Guysahul Desai, [place] Sidonie Dickens- C [facility] Pgr: [phone]  or [phone] 04/11/2014, 10:08 PM   Levy Pupaobert Tyon Cerasoli, MD, PhD 04/21/2014, 5:57 AM [facility] [phone] or if no answer [phone]

## 2014-04-06 NOTE — Progress Notes (Signed)
PCCM Interval Note  Pt evaluated around 18:30. [facility], dyspnea, shock and apparent sepsis without clear source. He was found to have lactic acidosis superimposed on acute renal failure, elevated total CK. I was called to evaluate due to progression of his altered MS and respiratory failure. He was agonal and was intubated. Norepi quickly titrated to 35. Note that bicarb gtt had already been started. Procalcitonin is profoundly elevated  Filed Vitals:   06/17/13 1900 06/17/13 1915 06/17/13 1930 06/17/13 1940  BP: 98/68 57/19 100/69   Pulse:      Temp:    102.9 F (39.4 C)  TempSrc:    Oral  Resp: 22 32 29   Weight:      SpO2:        Recent Labs Lab 06/17/13 1442 06/17/13 1452  HGB 18.4* 20.4*  HCT 53.4* 60.0*  WBC 24.1*  --   PLT 62*  --     Recent Labs Lab 06/17/13 1442 06/17/13 1452  NA 132* 127*  K 4.8 5.3  CL 84* 95*  CO2 11*  --   GLUCOSE 101* 101*  BUN 53* 53*  CREATININE 3.11* 3.90*  CALCIUM 9.4  --     Recent Labs Lab 06/17/13 1452 06/17/13 1820  PHART  --  7.257*  PCO2ART  --  31.2*  PO2ART  --  88.0  HCO3  --  13.9*  TCO2 16 15  O2SAT  --  95.0    Impression:  Septic shock, source unclear Possible superimposed cardiogenic shock component  Severe lactic acidosis  Acute renal failure Thrombocytopenia Hypocalcemia    Plans:  Vent adjusted, will follow ABG CXR now post-intubation Repeat CMP, lactate troponins Sedation initiated Consider adding vasopressin  45 minutes CC time  Levy Pupaobert Anuja Manka, MD, PhD 04/08/2014, 7:58 PM [facility] [phone] or if no answer [phone]

## 2014-04-06 NOTE — Progress Notes (Signed)
Pt arrived from ED at 1715 tachypnea 40s-50s, labored respirations. HR 160s, BP 93/61. Unable to obtain pulse ox due to cold extremities. Weak, palpable pulses. MD Sood camera in from [place] and aware. PA Rahul Desai at bedside at 1800. Pt having agonal breathing and code called, intubated, see attached code sheet.  Jonathon Cunningham L

## 2014-04-06 NOTE — Consult Note (Signed)
CARDIOLOGY CONSULT NOTE   Patient ID: Jonathon Cunningham MRN: [id number], DOB/AGE: November 24, 1960   Admit date: 04/10/2014 Date of Consult: 04/27/2014   Primary Physician: Janit PaganENIOLA, KEHINDE, MD Primary Cardiologist: New  Pt. Profile  Jonathon Cunningham is a 53 year old male with PMH of chronic back pain and sciatica who presented to the ED on 04/14/2014 for shortness of breath, weakness in legs bilaterally, and slurred speech starting today.  Problem List  Past Medical History  Diagnosis Date  . Chronic back pain   . Hematuria   . Sciatic pain     History reviewed. No pertinent past surgical history.   Allergies  No Known Allergies  HPI   Jonathon Cunningham is a [age] year old male with PMH of chronic back pain and sciatica who presented to the ED on 04/29/2014 for shortness of breath, weakness in legs bilaterally, and slurred speech starting today. History is gathered from past records due to patient's unstable medical condition and altered mental status at time of exam.   [facility] for his chronic back pain and was given Robaxin and Tramadol. Informed ED physician that SOB started after taking the medications. Informed staff he usually takes Percocet for the back pain but had ran out of the medication and that is why he presented to the ED on 04/03/2014. Patient reported to nursing staff his leg weakness started yesterday and slurred speech started today. Patient was not oriented to time, for he told nursing staff the slurred speech started at 3:00PM today, yet he told them this at 2:00PM.  Upon presenting to the ED, initial BP was 85/59, temperature was 96.2 F, and respirations were rapid at 32 per minute. While in the ED, BP dropped to 63/28 around 4:00 PM and at that time HR was 163 and respirations were 42. Significant lab values included lactate of 15, creatinine at 3.9, BNP of 27,258, WBC of 24.1 with Neutrophils at 92%, Hgb 20.4  Unable to obtain any past  medical history or family medical history from the patient. Patient informed ED physician he consumes alcohol regularly, but EtOH undetectable when tested while in ED. AST>ASLT > 2:1. Past medical records (ED note 11/16/2012) mentioned possible history of IV drug use.   Inpatient Medications  . folic acid  1 mg Intravenous Daily  . sodium chloride  1,000 mL Intravenous Once  . thiamine IV  500 mg Intravenous Q24H    Family History No family history on file.   Social History History   Social History  . Marital Status: Divorced    Spouse Name: N/A    Number of Children: N/A  . Years of Education: N/A   Occupational History  . Not on file.   Social History Main Topics  . Smoking status: Former Games developermoker  . Smokeless tobacco: Not on file  . Alcohol Use: Yes  . Drug Use: No  . Sexual Activity: Not on file   Other Topics Concern  . Not on file   Social History Narrative     Review of Systems  Review of systems unable to be obtained due to patient's medical condition and altered mental status.  Physical Exam  Blood pressure 135/121, temperature 96.2 F (35.7 C), temperature source Oral, resp. rate 45.  General: In acute distress. Psych: Alert but not oriented to person, time, or place Neuro:  HEENT: Normal  Neck: Supple without bruits. No elevated JVD. Lungs:  Respirations very rapid. CTA without wheezing  or rales, + use to accessory muscle. Heart: Rapid heart rate. No s3, s4, or murmurs. Abdomen: Soft, non-tender, non-distended, BS + x 4.  Extremities: No clubbing, No cyanosis or edema. Lower extremities cold to touch. Distal pulses diminished.  Labs  No results for input(s): CKTOTAL, CKMB, TROPONINI in the last 72 hours. Lab Results  Component Value Date   WBC 24.1* 04/09/2014   HGB 20.4* 04/17/2014   HCT 60.0* 04/16/2014   MCV 86.5 05/01/2014   PLT 62* 04/25/2014    Recent Labs Lab 04/05/2014 1442 04/11/2014 1452  NA 132* 127*  K 4.8 5.3  CL 84* 95*  CO2  11*  --   BUN 53* 53*  CREATININE 3.11* 3.90*  CALCIUM 9.4  --   PROT 7.5  --   BILITOT 2.0*  --   ALKPHOS 132*  --   ALT 66*  --   AST 234*  --   GLUCOSE 101* 101*   Lab Results  Component Value Date   CHOL 214* 11/08/2009   HDL 68 11/08/2009   LDLCALC 132* 11/08/2009   TRIG 71 11/08/2009    Radiology/Studies  Dg Chest Port 1 View - 04/04/2014   CLINICAL DATA:  53 year old male with shortness of breath and upper extremity swelling  EXAM: PORTABLE CHEST - 1 VIEW  COMPARISON:  01/10/2006  FINDINGS: The cardiac silhouette and mediastinal contours are within normal limits. There is no focal airspace consolidation, pleural effusion or pneumothorax. There is no overt pulmonary edema. There is no acute osseous abnormality.  IMPRESSION: No focal airspace consolidation.   Electronically Signed   By: Fannie KneeKenneth  Cunningham   On: 04/17/2014 15:45    ECG -04/16/2014 Sinus tachycardia with rate ~ 150bpm and prolonged QT interval.  ASSESSMENT AND PLAN  1. Acute respiratory failure  - initially arrived in the ED AxO, however mental status rapidly decreased associated with respiratory failure  - requiring intubation shortly after arrival in ICU  - unclear etiology due to lack of information, high suspicion of septic combined with cardiogenic shock and possible ETOH withdraw resulting in multiple organ failure as noted below  - patient is clearly critically ill and likely have poor prognosis, cardiology team will follow you. Pending formal echo result  2. Severe metabolic acidosis: CO2 11  3. Shock, possibly septic + cardiogenic  - peripheral extremity cold to touch. Lactic acidosis 13.26. WBC 24.1  - PCCC on board, intubated, on pressor. Pending blood culture  4. Elevated transaminase AST (234) > ALT (66)  - h/o drinking, 4 beers per day  5. LV dysfunction  - seen by Dr. Anne FuSkains with bedside portable echo, EF around 35-40%, image poor  - initial CXR negative for acute process  6. Elevated  proBNP: 27258   - no obvious HF symptom on arrival. CXR negative for acute process    Signed, Azalee CourseMeng, Hao, PA-C 04/23/2014, 5:14 PM   Personally seen and examined. Agree with above.  Developed acute respiratory failure over the last hour. Bedside portable echo with EF in the 40 range. Was hypothermic, hemoconcentrated on arrival with leukocytosis and metabolic acidosis. Acute mental status change with code blue called and successful intubation took place. No cardiomegally on CXR. Formal ECHO pending. Co-ox would be helpful to determine cardiac output. Nonetheless, very ill, critically ill.  Critical care time 40 min. Spent with patient, nurses, code, discussion with Dr. Delton CoombesByrum.   Donato SchultzSKAINS, MARK, MD

## 2014-04-06 NOTE — Procedures (Signed)
Intubation Procedure Note Francesco RunnerKenneth M Fiola [id number] 31-Dec-1960  Procedure: Intubation Indications: Respiratory insufficiency  Procedure Details Consent: Unable to obtain consent because of emergent medical necessity. Time Out: Verified patient identification, verified procedure, site/side was marked, verified correct patient position, special equipment/implants available, medications/allergies/relevent history reviewed, required imaging and test results available.  Performed  Drugs:  None DL x 1 with # 4 MAC blade. 8.0 tube passed through cords under direct visualization.  Evaluation Hemodynamic Status: Transient hypotension treated with pressors; O2 sats: transiently fell during during procedure Patient's Current Condition: stable Complications: No apparent complications Patient did tolerate procedure well. Chest X-ray ordered to verify placement.  CXR: pending.  Called to bedside to assess pt who was in obvious profound respiratory distress.  BiPAP initially ordered as Dr. Delton CoombesByrum was in route to hospital.  While awaiting BiPAP, pt desaturated before monitor would not pick up SpO2.  Pt became agonal with extremely poor respiratory effort before essentially becoming apneic.  Code blue was subsequently called due to respiratory arrest.  Pt intubated emergently.  Rutherford Guysahul Desai, [place] - C [facility] Pgr: [phone]  or [phone] 04/12/2014, 8:01 PM   Attending Note Pt was intubated emergently during respiratory arrest by Ihor Dow Desai. I have seen the pt and reviewed the CXR. ETT in good position and pt ventilating. Will follow.   Levy Pupaobert Unique Sillas, MD, PhD 04/10/2014, 9:08 PM [facility] [phone] or if no answer [phone]

## 2014-04-06 NOTE — ED Notes (Signed)
icu md returned call and informed of pts status incl vs and changes and 1 iv line

## 2014-04-06 NOTE — Progress Notes (Signed)
Pt began to have EKG changes approximately 2010.  EKG showed wide QRS tachycardia, nonspecific intraventricular block.  [facility].  HR then began slowly dropping from 160s to 120s.  Pt had approximately 13 beat run of Vtach before converting back to ST.  Pt then lost pulse at 2025, CPR began, see code sheet.  Code stopped at 2047 by Dr. Delton CoombesByrum.  Pt's cousin Molly MaduroRobert notified.    Roselie AwkwardShannon Pilar Corrales, RN

## 2014-04-06 NOTE — Progress Notes (Signed)
ANTIBIOTIC CONSULT NOTE - INITIAL  Pharmacy Consult for vancomycin and Zosyn Indication: sepsis  No Known Allergies  Patient Measurements: Weight: 151 lb 7.3 oz (68.7 kg)  Vital Signs: Temp: 96.2 F (35.7 C) (11/05 1341) Temp Source: Oral (11/05 1341) BP: 104/68 mmHg (11/05 1745) Pulse Rate: 69 (11/05 1745) Intake/Output from previous day:   Intake/Output from this shift:    Labs:  Recent Labs  04/14/2014 1442 04/25/2014 1452  WBC 24.1*  --   HGB 18.4* 20.4*  PLT 62*  --   CREATININE 3.11* 3.90*   CrCl cannot be calculated (Unknown ideal weight.). No results for input(s): VANCOTROUGH, VANCOPEAK, VANCORANDOM, GENTTROUGH, GENTPEAK, GENTRANDOM, TOBRATROUGH, TOBRAPEAK, TOBRARND, AMIKACINPEAK, AMIKACINTROU, AMIKACIN in the last 72 hours.   Microbiology: No results found for this or any previous visit (from the past 720 hour(s)).  Medical History: Past Medical History  Diagnosis Date  . Chronic back pain   . Hematuria   . Sciatic pain     Assessment: [age] YOM who presented to ED with SOB, leg weakness and slurred speech. Poor historian d/t encephalopathy presumed from cardiac shock or EtOH withdrawal. Nearly coded this evening and started on Levophed this evening. Questionable sepsis from unknown source leading to respiratory distress and intubation- to start vancomycin and Zosyn. SCr 3.9, est CrCl ~6021mL/min. WBC elevated at 24.1, hypothermic at this point.  Goal of Therapy:  Vancomycin trough level 15-20 mcg/ml  Plan:  1. Zosyn 3.375g IV q8h EI 2. Vancomycin 1250mg  IV x1 as load, then 750mg  IV q24h 3. Follow c/s, clinical progression, renal function, [facility]  Elbert Polyakov D. Fares Ramthun, PharmD, BCPS Clinical Pharmacist Pager: [phone] 04/28/2014 6:58 PM

## 2014-04-06 NOTE — ED Notes (Signed)
md called to bedside

## 2014-04-06 NOTE — Code Documentation (Signed)
PATIENT NAME: Jonathon RunnerKenneth M Cunningham MEDICAL RECORD NUMBER: [id number] Birthday: 05-15-1961  Age: 53 y.o. Admit Date: 04/23/2014  Provider:  Dr. Delton CoombesByrum  Indication: Cardiac Arrest  Technical Description:   CPR performance duration: 27 minutes  Was defibrillation or cardioversion used ? Yes  Was external pacer placed ? No  Was patient intubated pre/post CPR ? Yes  Was transvenous pacer placed ? No  Medications Administered Include      Yes/no Amiodarone    YES  Atropin   Calcium    YES  Epinephrine    YES  Lidocaine   Magnesium    YES  Norepinephrine    YES  Phenylephrine   Sodium bicarbonate    YES  Vasopression    Evaluation  Final Status - Was patient successfully resuscitated ? No  Miscellaneous Information Time of death:  2047  Pt critically ill prior to cardiac arrest.  Intubated earlier in the evening for respiratory arrest.  Had episode of non-sustained V.tach which self converted to NSR prior to arrest.  Shortly thereafter, pt lost pulses and ACLS protocol initiated.  Pt unfortunately was not able to be resuscitated.   Rutherford Guysahul Royetta Probus, PA - C [place] [facility] Pgr: [phone]  or [phone]  11/5/201510:09 PM

## 2014-04-06 NOTE — ED Notes (Signed)
Pt presents with onset of shortness of breath after [facility]  [facility], began to have shortness of breath, denies any tongue swelling, difficulty swallowing or itching, denies cough.  Pt reports dizziness, especially with standing.

## 2014-04-06 NOTE — ED Notes (Signed)
Pt was attempting to gert out of bed pt reposuitioned in bed by staff x2 iv therapist at bedside trying for 2nd iv line bp 85/57 hr 164/min resp rate 44/min and sat rermains unable to detect from all 4 extrm bilat ear lobes and forehead ermd informed call paged to dr. Sung Amabilesimonds

## 2014-04-06 NOTE — ED Notes (Signed)
Lab called will add on bnp to blood in lab

## 2014-04-06 NOTE — ED Notes (Signed)
icu md in at bedside

## 2014-04-06 NOTE — H&P (Signed)
PULMONARY / CRITICAL CARE MEDICINE   Name: Jonathon RunnerKenneth M Schewe MRN: [id number] DOB: 1960/12/18    ADMISSION DATE:  04/02/2014 CONSULTATION DATE:  04/23/2014  REFERRING MD :  EDP  CHIEF COMPLAINT:  SOB, Pain and cramps in bilateral legs, weakness.  INITIAL PRESENTATION:  53 [facility]. M [facility] with complaints of SOB, pain and cramps in both of his legs.  In ED, he was found to be significantly dehydrated with initial labwork c/w severe sepsis.  PCCM was consulted for admission.   STUDIES:  CT Head 11/5 >>> CT Chest 11/5 >>> CT Abd / Pelv 11/5 >>>  SIGNIFICANT EVENTS: 11/5 - admitted with severe sepsis, etiology unclear. 11/5 - suffered respiratory arrest   HISTORY OF PRESENT ILLNESS:  Mr. Yetta BarreJones is a 53 y.o. M with PMH of chronic back pain, hematuria, sciatica, who [facility] with CC of SOB and pain and weakness in bilateral legs x 2 days.  [facility] and was given valium, tordol, and percocet and prescriptions for tramadol and robaxin. Per chart review, pt takes multiple narcotics as outpatient due to chronic back pain.  SOB has been persistent though mild in nature.  Denied any chest pain, cough, fevers/chills/sweats, N/V/D.  Has had some vague abdominal pain.  Main complaint is pain in bilateral legs.  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  In ED, workup revealed dehydration, lactic acidosis, and severe sepsis of unknown etiology.  PCCM was consulted for admission.   PAST MEDICAL HISTORY :   has a past medical history of Chronic back pain; Hematuria; and Sciatic pain.  has no past surgical history on file. Prior to Admission medications   Medication Sig Start Date End Date Taking? Authorizing Provider  cyclobenzaprine (FLEXERIL) 10 MG tablet Take 1 tablet (10 mg total) by mouth 3 (three) times daily as needed for muscle spasms. 10/28/13    Graylon GoodZachary H Baker, PA-C  cyclobenzaprine (FLEXERIL) 5 MG tablet Take 1-2 tablets (5-10 mg total) by mouth 2 (two) times daily as needed for muscle spasms. 11/16/12   Tiffany Irine SealG Greene, PA-C  etodolac (LODINE) 500 MG tablet Take 1 tablet (500 mg total) by mouth 2 (two) times daily. 10/28/13   Graylon GoodZachary H Baker, PA-C  HYDROcodone-acetaminophen (NORCO) 7.5-325 MG per tablet Take 1 tablet by mouth every 4 (four) hours as needed for moderate pain. 10/28/13   Graylon GoodZachary H Baker, PA-C  ibuprofen (ADVIL,MOTRIN) 800 MG tablet Take 1 tablet (800 mg total) by mouth 3 (three) times daily. 11/16/12   Tiffany Irine SealG Greene, PA-C  methocarbamol (ROBAXIN) 500 MG tablet Take 1 tablet (500 mg total) by mouth 2 (two) times daily. 04/03/14   Garlon HatchetLisa M Sanders, PA-C  oxyCODONE-acetaminophen (PERCOCET) 10-325 MG per tablet Take 1-2 tablets by mouth every 8 (eight) hours as needed for pain.    Historical Provider, MD  traMADol (ULTRAM) 50 MG tablet Take 1 tablet (50 mg total) by mouth every 6 (six) hours as needed. 04/03/14   Garlon HatchetLisa M Sanders, PA-C   No Known Allergies  FAMILY HISTORY:  No family history on file.  SOCIAL HISTORY:  reports that he has quit smoking. He does not have any smokeless tobacco history on file. He reports that he drinks alcohol. He reports that he does not use illicit drugs.  REVIEW OF SYSTEMS:   All  negative; except for those that are bolded, which indicate positives.  Constitutional: weight loss, weight gain, night sweats, fevers, chills, fatigue, weakness.  HEENT: headaches, sore throat, sneezing, nasal congestion, post nasal drip, difficulty swallowing, tooth/dental problems, visual complaints, visual changes, ear aches. Neuro: difficulty with speech, weakness, numbness, ataxia. CV:  chest pain, orthopnea, PND, swelling in lower extremities, dizziness, palpitations, syncope.  Resp: cough, hemoptysis, mild dyspnea, wheezing. GI  heartburn, indigestion, vague abdominal pain, nausea, vomiting, diarrhea,  constipation, change in bowel habits, loss of appetite, hematemesis, melena, hematochezia.  GU: dysuria, change in color of urine, urgency or frequency, flank pain, hematuria. MSK: joint pain or swelling, decreased range of motion, back pain. Psych: change in mood or affect, depression, anxiety, suicidal ideations, homicidal ideations. Skin: rash, itching, bruising.   SUBJECTIVE:   VITAL SIGNS: Temp:  [96.2 F (35.7 C)-102.9 F (39.4 C)] 102.9 F (39.4 C) (11/05 1940) Pulse Rate:  [61-169] 61 (11/05 1830) Resp:  [21-50] 29 (11/05 1930) BP: (57-135)/(19-121) 100/69 mmHg (11/05 1930) SpO2:  [83 %-90 %] 89 % (11/05 1830) Arterial Line BP: (88-117)/(45-59) 98/51 mmHg (11/05 2000) FiO2 (%):  [50 %] 50 % (11/05 1808) Weight:  [68.7 kg (151 lb 7.3 oz)] 68.7 kg (151 lb 7.3 oz) (11/05 1721) HEMODYNAMICS:   VENTILATOR SETTINGS: Vent Mode:  [-] PRVC FiO2 (%):  [50 %] 50 % Set Rate:  [14 bmp] 14 bmp Vt Set:  [500 mL] 500 mL PEEP:  [5 cmH20] 5 cmH20 Plateau Pressure:  [19 cmH20] 19 cmH20 INTAKE / OUTPUT: Intake/Output      11/05 0701 - 11/06 0700   I.V. (mL/kg) 140.6 (2)   IV Piggyback 50   Total Intake(mL/kg) 190.6 (2.8)   Urine (mL/kg/hr) 40   Total Output 40   Net +150.6         PHYSICAL EXAMINATION: General: Thin male, dehydrated, appears older than stated age, occasional tremor of UE's. Neuro: A&O x 3, MAE's HEENT: [place]AT, significantly dry lips, tongue, and mucous membranes Cardiovascular: Sinus tachycardia, no M/R/G. Lungs: Resps shallow and rapid.  CTA, no W/R/R. Abdomen: Hypoactive BS, soft, mildly distended, non-tender. Musculoskeletal: Pronounced calf muscles, digital clubbing on left hand, no gross deformities. Skin: Cool extremities, skin dry.  LABS:  CBC  Recent Labs Lab 2014/04/15 1442 04/15/14 1452  WBC 24.1*  --   HGB 18.4* 20.4*  HCT 53.4* 60.0*  PLT 62*  --    Coag's  Recent Labs Lab Apr 15, 2014 1442  APTT 31  INR 1.04    BMET  Recent Labs Lab 2014/04/15 1442 04/15/14 1452 April 15, 2014 1828  NA 132* 127* 130*  K 4.8 5.3 4.6  CL 84* 95* 98  CO2 11*  --  10*  BUN 53* 53* 57*  CREATININE 3.11* 3.90* 2.77*  GLUCOSE 101* 101* 81   Electrolytes  Recent Labs Lab 04-15-14 1442 2014/04/15 1828  CALCIUM 9.4 5.9*   Sepsis Markers  Recent Labs Lab 04-15-2014 1452 04-15-14 1838  LATICACIDVEN 13.26* 5.5*  PROCALCITON  --  97.12   ABG  Recent Labs Lab 2014-04-15 1820  PHART 7.257*  PCO2ART 31.2*  PO2ART 88.0   Liver Enzymes  Recent Labs Lab 15-Apr-2014 1442  AST 234*  ALT 66*  ALKPHOS 132*  BILITOT 2.0*  ALBUMIN 2.2*   Cardiac Enzymes  Recent Labs Lab 04/15/2014 1442 04/15/14 1830  TROPONINI  --  <0.30  PROBNP 27258.0*  --    Glucose  Recent Labs Lab 04/15/2014 1413 04/15/14 1925  GLUCAP 108* <10*  Imaging No results found.    ASSESSMENT / PLAN:  PULMONARY A: Dyspnea - multifactorial, likely primarily due to lactic acidosis P:   Supplemental O2 as needed. Monitor for signs of distress. Follow CXR.  CARDIOVASCULAR CVL R fem 11/5 >>> A:  Severe sepsis - without evidence of shock Elevated BNP ? PAD - cool extremities + leg pain / weakness P:  Goal MAP > 65. Trend troponin / lactate. Check cortisol. Levophed if needed. Echo. Cards consult (per EDP, bedside echo suggestive of extremely low EF)  RENAL A:   AG metabolic acidosis - lactate 13 AKI - likely combo of pre-renal + ATN from hypovolemia/hypoperfusion Hyponatremia P:   Aggressive hydration. Acetaminophen / Salicylate levels. Consider osmoles. BMP in AM.  GASTROINTESTINAL A:   Abd pain - physical exam re-assuring Transaminitis - primarily AST, fits hx of ETOH use Nutrition P:   If no improvement, consider imaging. Trend LFT's. NPO for now.  HEMATOLOGIC A:   Hemoconcentration VTE Prophylaxis P:  SCD's / Heparin. CBC in AM.  INFECTIOUS A:   Severe Sepsis - unclear etiology, clinically  not appearing infected P:   BCx2 11/5 UCx 11/5 Check PCT, if high then empiric abx.  ENDOCRINE A:   No known issues. P:   SSI if CBG's consistently > 180.  NEUROLOGIC A:   Acute metabolic encephalopathy  ? Withdrawal - beginning to get somewhat agitated and having tremors Chronic Back Pain - L4/L5 Hx of RLE paresthesias P:   STAT head CT. UDS. ETOH level. Acetaminophen / Salicylate levels. Precedex for agitation. Monitor clinically.   Family updated: None available.  Interdisciplinary Family Meeting v Palliative Care Meeting:  Due by: 11/12.   Rutherford Guysahul Samik Balkcom, [place]PA - C [place] [facility] Pgr: [phone]  or [phone] 04/27/2014, 8:18 PM

## 2014-04-06 NOTE — Code Documentation (Signed)
CODE BLUE NOTE  Patient Name: Jonathon RunnerKenneth M Cunningham   MRN: [id number]   Date of Birth/ Sex: 26-Feb-1961 , male      Admission Date: 04/10/2014  Attending Provider: Merwyn Katosavid B Simonds, MD  Primary Diagnosis: Cardiogenic shock [R57.0] Lactic acidosis [E87.2] Altered mental status [R41.82] Dyspnea [R06.00] Acute renal failure, unspecified acute renal failure type [N17.9]    Indication: Pt was in his usual state of health until this PM, when he was noted to be in  [place]. Code blue was subsequently called. At the time of arrival on scene, ACLS protocol was underway.   Technical Description:  - CPR performance duration:  27  minutes  - Was defibrillation or cardioversion used? Yes   - Was external pacer placed? No  - Was patient intubated pre/post CPR? Yes    Medications Administered: Y = Yes; Blank = No Amiodarone  Y  Atropine    Calcium    Epinephrine  Y  Lidocaine    Magnesium  Y  Norepinephrine  Y  Phenylephrine    Sodium bicarbonate  Y  Vasopressin    Other     Post CPR evaluation:  - Final Status - Was patient successfully resuscitated ? No   Miscellaneous Information:  - Time of death:  2047  PM  - Primary team notified?  Yes  - Family Notified? Yes        Shirlee LatchAngela Bacigalupo, MD   04/15/2014, 8:52 PM

## 2014-04-06 NOTE — ED Provider Notes (Addendum)
TIME SEEN: 2:14 PM  CHIEF COMPLAINT: shortness of breath, weakness, slurred speech  HPI: Pt is a 53 y.o. M with history of chronic back pain who presents to the emergency department with multiple complaints. Patient appears confused and is a poor historian which makes history very limited. He is complaining of shortness of breath, weakness in his lower extremities and circumflex speech. It is unclear when his symptoms started. He states that his slurred speech started at 3 PM today but is not yet 3 PM. He states that the weakness in his bilateral legs started yesterday. He was seen at [facility] 11/2 for back pain and was discharged with tramadol and Robaxin.  Patient reports he came to the emergency department for shortness of breath. He does report that he drink alcohol regularly, unclear when his last drink was.  In the ED he is tachycardic, appears extremely dehydrated, tachypnea.  ROS: level V caveat for altered mental status  PAST MEDICAL HISTORY/PAST SURGICAL HISTORY:  Past Medical History  Diagnosis Date  . Chronic back pain   . Hematuria   . Sciatic pain     MEDICATIONS:  Prior to Admission medications   Medication Sig Start Date End Date Taking? Authorizing Provider  cyclobenzaprine (FLEXERIL) 10 MG tablet Take 1 tablet (10 mg total) by mouth 3 (three) times daily as needed for muscle spasms. 10/28/13   Graylon Good, PA-C  cyclobenzaprine (FLEXERIL) 5 MG tablet Take 1-2 tablets (5-10 mg total) by mouth 2 (two) times daily as needed for muscle spasms. 11/16/12   Tiffany Irine Seal, PA-C  etodolac (LODINE) 500 MG tablet Take 1 tablet (500 mg total) by mouth 2 (two) times daily. 10/28/13   Graylon Good, PA-C  HYDROcodone-acetaminophen (NORCO) 7.5-325 MG per tablet Take 1 tablet by mouth every 4 (four) hours as needed for moderate pain. 10/28/13   Graylon Good, PA-C  ibuprofen (ADVIL,MOTRIN) 800 MG tablet Take 1 tablet (800 mg total) by mouth 3 (three) times daily. 11/16/12   Tiffany  Irine Seal, PA-C  methocarbamol (ROBAXIN) 500 MG tablet Take 1 tablet (500 mg total) by mouth 2 (two) times daily. 04/03/14   Garlon Hatchet, PA-C  oxyCODONE-acetaminophen (PERCOCET) 10-325 MG per tablet Take 1-2 tablets by mouth every 8 (eight) hours as needed for pain.    Historical Provider, MD  traMADol (ULTRAM) 50 MG tablet Take 1 tablet (50 mg total) by mouth every 6 (six) hours as needed. 04/03/14   Garlon Hatchet, PA-C    ALLERGIES:  No Known Allergies  SOCIAL HISTORY:  History  Substance Use Topics  . Smoking status: Former Games developer  . Smokeless tobacco: Not on file  . Alcohol Use: Yes    FAMILY HISTORY: No family history on file.  EXAM: BP 85/59 mmHg  Temp(Src) 96.2 F (35.7 C) (Oral)  Resp 32 CONSTITUTIONAL: Alert to person but does not respond to questions appropriately, difficult to understand secondary to his dysarthria HEAD: Normocephalic EYES: Conjunctivae clear, PERRL ENT: normal nose; no rhinorrhea; extremely drymucous membranes; pharynx without lesions noted; no uvular deviation, no tonsillar hypertrophy or exudate, multiple dental caries, no angioedema NECK: Supple, no meningismus, no LAD  CARD: regular and tachycardic; S1 and S2 appreciated; no murmurs, no clicks, no rubs, no gallops RESP: Normal chest excursion without splinting or patient is to But has clear breath sounds bilaterally without wheezing or rhonchi or rales, good aeration diffusely, no hypoxia ABD/GI: Normal bowel sounds; non-distended; soft, non-tender, no rebound, no guarding BACK:  The back appears normal and is non-tender to palpation, there is no CVA tenderness EXT: Normal ROM in all joints; non-tender to palpation; no edema; normal capillary refill; no cyanosis    SKIN: extremities are mildly cool to palpation with some mottling, he does have equal pulses in all of his extremities but his radial and DP pulses are diminished bilaterally NEURO:  significant dysarthria, no obvious facial  droop,unable to raise his legs off the bed but no pronator drift of his upper extremities; unable to accurately test sensation PSYCH: The patient's mood and manner are appropriate. Grooming and personal hygiene are appropriate.  MEDICAL DECISION MAKING: Patient here with multiple complaints and unclear etiology for his symptoms. He is tachycardic, tachypnea can initially hypotensive. He appears extremely dry on exam. This may be infectious in nature, related to dehydration, medication related, stroke or infarct. EKG shows a sinus tachycardia without ischemic changes. We'll give IV fluids, obtain CT head, labs, urine, tox workup.  Given unclear last seen normal, will not call a code stroke. CBG is 108. Rectal temperature 99.4.  ED PROGRESS: Patient's lactate is 15.creatinine is elevated at 3.9, hemoglobin is 20.4. He does appear dry on exam. Bedside ultrasound reveals that patient has minimal cardiac motion. He denies a history of cardiomyopathy but does have a history of alcohol abuse. While on BNP and slow his IV fluids. We'll discuss with cardiology as he may need inotropic. His blood pressure is 109/76. Suspect this is why he is having pain in his extremities because he is not perfusing his hands and feet and they are cool with diminished pulses.  3:30 PM  Dr. Sung AmabileSimonds with CCM has be consulted and is at bedside.  Pt's IVF have been slowed.  Pt now reports he normally gets Percocet for chronic back pain from his PCP Dr. Thea SilversmithMacKenzie and he ran out of this medication and that is why he presented to [facility] ago. He reports that today when he woke up he was unable to get up out of bed and walk because of burning and tingling in his bilateral lower extremities. He states he felt very short of breath. His dysarthria has mildly improved. He is not having any new back pain or new chest pain. He denies a known history of coronary artery disease or CHF. He states he does drink alcohol every day and smokes  marijuana occasionally but denies any other illicit drug use. States he did drink beer prior to arrival.   3:35 PM  CCM to admit.  They agree on holding abx at this time as there is no obvious source of infection and he is afebrile. They're aware that he does have a leukocytosis but this may be reactive. This appears to be cardiogenic in nature. Cultures are pending.  4:22 PM  Pt has some acute encephalopathy likely from decreased perfusion from his cardiogenic shock. Dr. Anne FuSkains with cardiology has been consult. He is awaiting an ICU bed. He does report he is feeling better but is still tachycardic, tachypneic but not hypoxic. He is mildly hypotensive. Critical care is aware.  4:37 PM  Pt appears to be acutely decompensating.  Critical care at bedside. They feel he is an acute withdrawal. We'll give IV Ativan per their request. They will place central line emergently.  Dr. Karma GanjaLinker in ED made aware of pt while he awaits ICU bed.  Pt is full code.   EKG Interpretation  Date/Time:  Thursday April 06 2014 14:03:07 EST Ventricular Rate:  156 PR Interval:  130 QRS Duration: 68 QT Interval:  344 QTC Calculation: 554 R Axis:   78 Text Interpretation:  Sinus tachycardia Probable left atrial enlargement Prolonged QT interval Confirmed by WARD,  DO, KRISTEN ([id number]) on 04/03/2014 2:11:05 PM          CRITICAL CARE Performed by: Raelyn NumberWARD, KRISTEN N   Total critical care time: 45 minutes  Critical care time was exclusive of separately billable procedures and treating other patients.  Critical care was necessary to treat or prevent imminent or life-threatening deterioration.  Critical care was time spent personally by me on the following activities: development of treatment plan with patient and/or surrogate as well as nursing, discussions with consultants, evaluation of patient's response to treatment, examination of patient, obtaining history from patient or surrogate, ordering and performing  treatments and interventions, ordering and review of laboratory studies, ordering and review of radiographic studies, pulse oximetry and re-evaluation of patient's condition.   Layla MawKristen N Ward, DO 04/05/2014 1622  Layla MawKristen N Ward, DO 04/16/2014 [phone]

## 2014-04-06 NOTE — ED Notes (Signed)
Lactic acid results given to Ward, DO 

## 2014-04-06 NOTE — Progress Notes (Signed)
Chaplain is following pt from ED. He was dropped off and not alert to obtain information for contact.   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   Chaplain contacted pt's cousin via cell phone [phone]).   His cousin is currently on the way.   Gala RomneyBrown, Ly Wass J, Chaplain 04/25/2014

## 2014-04-06 NOTE — ED Notes (Signed)
CBG 108. 

## 2014-04-06 NOTE — ED Notes (Signed)
BUE AND BLE REMAIN COLD TO TOUCH AND PALE PULSES ARE WEAK MD AWARE

## 2014-04-06 NOTE — Progress Notes (Signed)
Chaplain paged to return as pt's cousin and roommate arrived. Chaplain gave emotional support to roommate who was teary-eyed when entered the room. The patient's cousin is currently calling loved ones informing them of the events. No further support needed.   Gala RomneyBrown, Son Barkan J, Chaplain 04/17/2014

## 2014-04-06 NOTE — Procedures (Signed)
Central Venous Catheter Insertion Procedure Note Jonathon RunnerKenneth M Cunningham [id number] 05-May-1961  Procedure: Insertion of Central Venous Catheter Indications: Assessment of intravascular volume, Drug and/or fluid administration and Frequent blood sampling  Procedure Details Consent: Unable to obtain consent because of altered level of consciousness. Time Out: Verified patient identification, verified procedure, site/side was marked, verified correct patient position, special equipment/implants available, medications/allergies/relevent history reviewed, required imaging and test results available.  Performed  Maximum sterile technique was used including antiseptics, cap, gloves, gown, hand hygiene, mask and sheet. Skin prep: Chlorhexidine; local anesthetic administered A antimicrobial bonded/coated triple lumen catheter was placed in the left femoral vein due to emergent situation using the Seldinger technique.  Evaluation Blood flow good Complications: No apparent complications Patient did tolerate procedure well. Chest X-ray ordered to verify placement.  CXR: pending.  Procedure performed under direct ultrasound guidance for real time vessel cannulation.      Rutherford Guysahul Desai, [place] Sidonie Dickens- [facility] Pgr: [phone]  or [phone] 04/14/2014, 5:09 PM   Levy Pupaobert Byrum, MD, PhD 04/18/2014, 6:37 PM [facility] [phone] or if no answer [phone]

## 2014-04-06 NOTE — Progress Notes (Signed)
Chaplain paged that more family arrived and were distressed. Pt's younger brother expressed great anger and frustration because the pt went to [facility] two days ago and was "given two shots and sent home; now two days later I have to look at my brother laying here dead". Brother went on to note that he believes that his brother wasn't given adequate care and not treated with dignity. "He had insurance, how could they not draw blood or check on other things." Chaplain facilitated story telling to help facilitate grieving and explored his feelings of guilt not being here and anger at [facility]. Family is still grieving heavily and asked for some privacy.  Page if needed  Vickii PennaBrown, Albaro Deviney J, Chaplain 04/26/2014

## 2014-04-06 NOTE — ED Notes (Signed)
Pt states has had weakness to bilat legs since yesterday and slurred speech today since 3 pm though it is not 3 pm yet

## 2014-04-07 DIAGNOSIS — R570 Cardiogenic shock: Secondary | ICD-10-CM | POA: Insufficient documentation

## 2014-04-07 LAB — CORTISOL: CORTISOL PLASMA: 99.2 ug/dL

## 2014-04-07 MED FILL — Medication: Qty: 1 | Status: AC

## 2014-04-08 LAB — CULTURE, BLOOD (ROUTINE X 2)

## 2014-04-08 LAB — URINE CULTURE
COLONY COUNT: NO GROWTH
Culture: NO GROWTH

## 2014-04-10 LAB — GLUCOSE, CAPILLARY
Glucose-Capillary: <10 mg/dL — CL (ref 70–99)
Glucose-Capillary: 86 mg/dL (ref 70–99)

## 2014-04-20 NOTE — Discharge Summary (Signed)
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 3) Severe sinus tachycardia 4) Acute renal failure of unclear etiology 5) Severe lactic acidosis of unclear etiology 6) severe hypovolemia 7) Elevated total CPK c/w rhabdomyolysis  8) Acute hepatitis - likely alcoholic 9) Agitated delirium - suspected alcohol withdrawal syndrome   DISCHARGE DIAGNOSES:   1) Acute respiratory distress due to metabolic acidosis 2) dilated cardiomyopathy of unclear etiology  3) Severe sinus tachycardia 4) Acute renal failure - likely sepsis related ATN 5) Severe lactic acidosis due to occult septic shock 6) severe hypovolemia 7) Elevated total CPK  Likely due to GAS myositis 8) Acute hepatitis - likely alcoholic 9) Agitated delirium - suspected alcohol withdrawal syndrome 10) Group A streptococcal bacteremia 12) 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 M with PMH of chronic back pain, hematuria, sciatica, who [facility] ED [facility] with CC of SOB and pain and weakness in bilateral legs x 2 days. [facility] and was given valium, toradol, and percocet and prescriptions for tramadol and robaxin. Per chart review, pt takes multiple narcotics as outpatient due to chronic back pain. SOB has been persistent though mild in nature. Denied any chest pain, cough, fevers/chills/sweats, N/V/D. Has had some vague abdominal pain. Main complaint is pain in bilateral legs. 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  In ED, workup revealed dehydration, lactic acidosis, and severe sepsis of unknown etiology. PCCM was consulted for admission. A quick look echocardiogram was performed by the ED physician and reportedly revealed severe dilated cardiomyopathy with EF estimated @ 10%.   [facility] was contacted for admission but he deteriorated during his course in the ED with worsening tachycardia and agitation so PCCM was asked to evaluate. In addition, Cardiology was consulted.  HOSPITAL COURSE:   He received broad spectrum antibiotics in the emergency department and these were continued after admission. Shortly after arrival to the ICU, he progressively deteriorated with worsening dyspnea and agitation. He was intubated. An echocardiogram was repeated by Cardiology and his LVEF was estimated in the 35-40% range. Shortly thereafter he developed QRS widening and bradycardia which progressed to cardiac arrest. Prolonged resuscitative efforts were undertaken but were unsuccessful.  On the day following his admission and death, his blood cultures returned positive for group A streptococcus.     Cause of death:  Severe sepsis/septic shock due to group A streptococcus bacteremia likely due to spontaneous myositis  Contributing factors: EtOH    Jonathon Fischeravid Simonds, MD;  PCCM service; Mobile [phone]

## 2014-05-02 NOTE — Progress Notes (Signed)
CRITICAL VALUE ALERT  Critical value received:  Ca 5.9, CO2 10  Date of notification:  04/29/2014  Time of notification:  1955  Critical value read back:Yes.    Nurse who received alert:  Roselie AwkwardShannon Jamillah Camilo, RN  MD notified (1st page):  Dr. Delton CoombesByrum at bedside  Time of first page:  1955

## 2014-05-02 NOTE — Procedures (Signed)
PCCM Code Note  Please refer also to CPR documentation and to R Desai's note.  I participated in ACLS and CPR, the duration was approximately 25-30 minutes.  Initial rhythm was VT, then PEA. He was never stabilized although pulses were obtained on 3 occasions.  He expired on 03/06/14 at 20:47  Levy Pupaobert Estle Huguley, MD, PhD 04/26/2014, 6:01 AM [facility] [phone] or if no answer [phone]

## 2014-05-02 DEATH — deceased

## 2015-07-11 IMAGING — CR DG CHEST 1V PORT
1 series · 1 of 1 positions shown · non-contrast
Comparison: 04/06/2014 at [DATE] p.m.

CLINICAL DATA: Intubation.  Acute respiratory failure.

EXAM:
PORTABLE CHEST - 1 VIEW [DATE] p.m.

[portable]
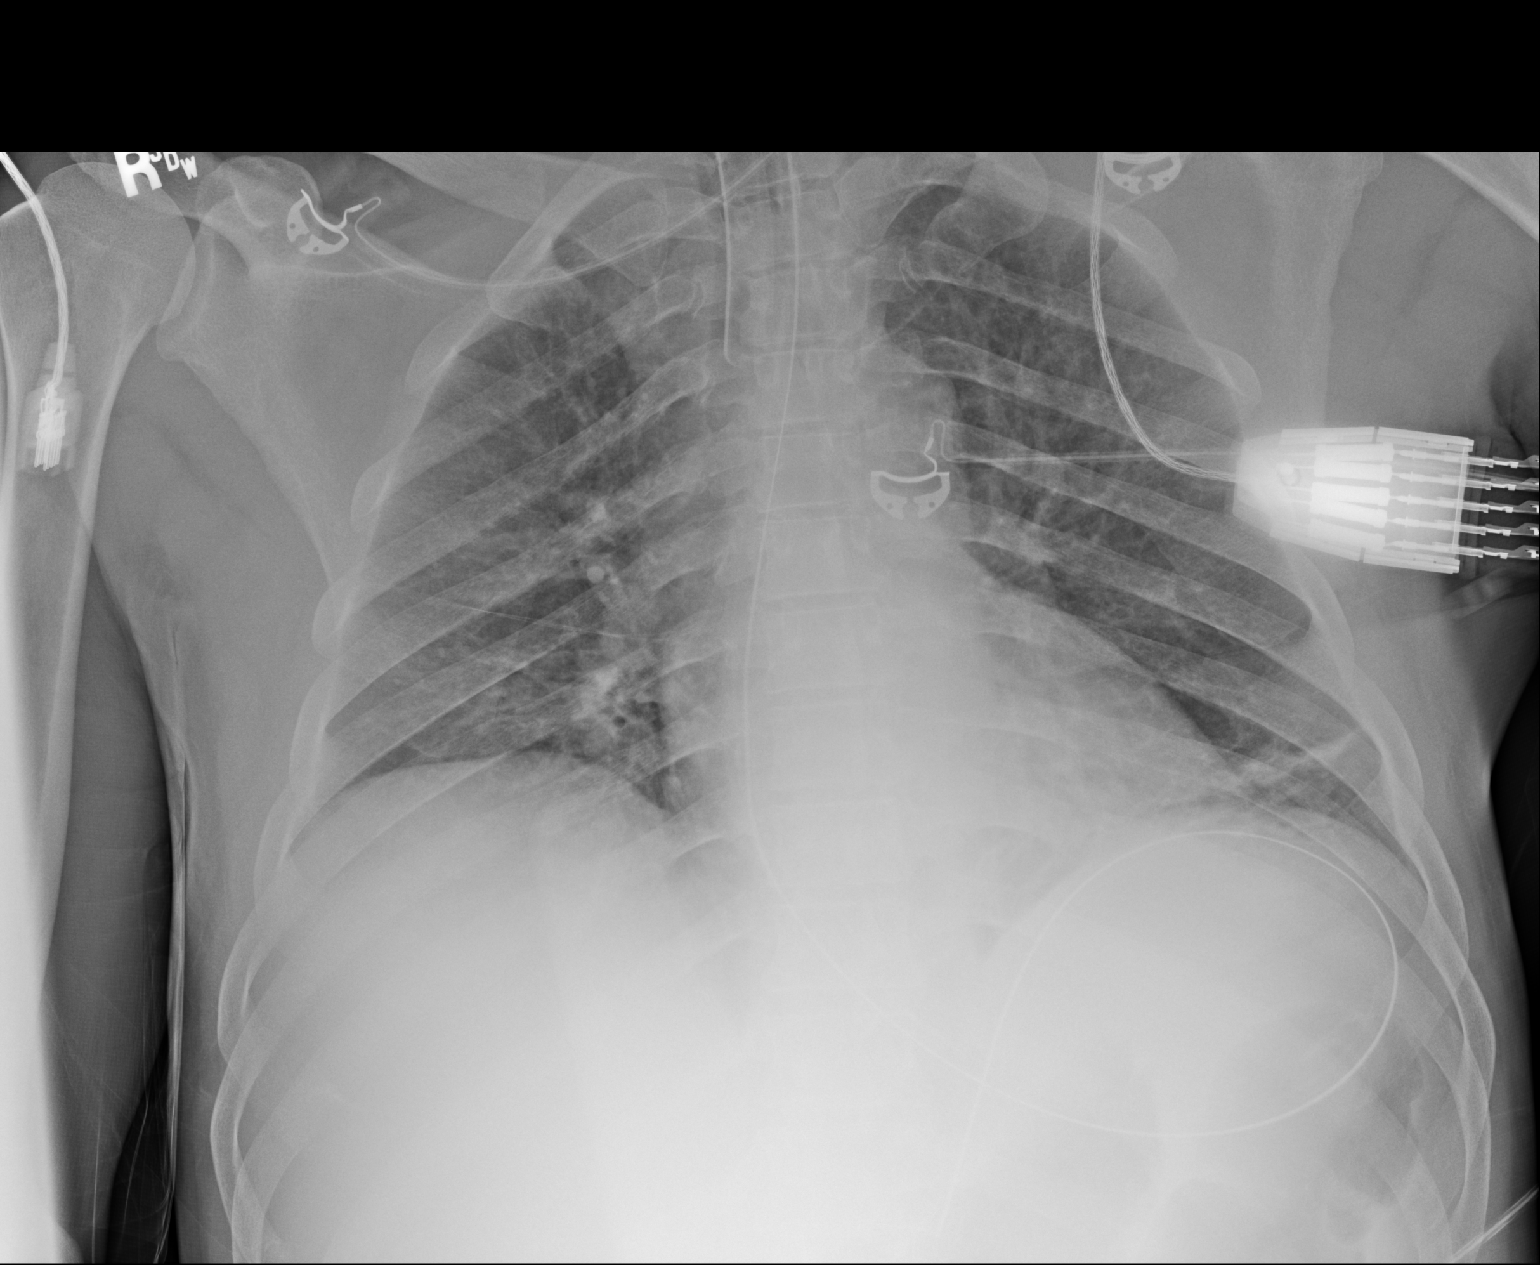

[1 of 1 positions shown; findings below may reference images not displayed]

FINDINGS: Endotracheal tube is in good position approximately 3.5 cm above the
carina. The NG tube tip is in the distal stomach.

Heart size and pulmonary vascularity are normal. There is slight
atelectasis at both lung bases. No infiltrates or effusions. No
acute osseous abnormality.
IMPRESSION: Slight bibasilar atelectasis. Endotracheal tube and NG tube appear
in good position.
